# Patient Record
Sex: Female | Born: 1990
Health system: Southern US, Community
[De-identification: ages and names within clinical notes are randomized; demographics above are authoritative.]

## PROBLEM LIST (undated history)

## (undated) DIAGNOSIS — F53 Postpartum depression: Secondary | ICD-10-CM

## (undated) DIAGNOSIS — Z319 Encounter for procreative management, unspecified: Secondary | ICD-10-CM

## (undated) DIAGNOSIS — Z309 Encounter for contraceptive management, unspecified: Principal | ICD-10-CM

## (undated) DIAGNOSIS — N390 Urinary tract infection, site not specified: Principal | ICD-10-CM

## (undated) HISTORY — DX: Encounter for contraceptive management, unspecified: Z30.9

## (undated) HISTORY — PX: WISDOM TOOTH EXTRACTION: SHX21

## (undated) HISTORY — PX: URETHRAL DILATION: SUR417

## (undated) HISTORY — DX: Urinary tract infection, site not specified: N39.0

## (undated) HISTORY — DX: Encounter for procreative management, unspecified: Z31.9

## (undated) HISTORY — DX: Postpartum depression: F53.0

---

## 2011-07-09 ENCOUNTER — Other Ambulatory Visit (HOSPITAL_COMMUNITY)
Admission: RE | Admit: 2011-07-09 | Discharge: 2011-07-09 | Disposition: A | Discharge status: Home / Self Care | Payer: BC Managed Care – PPO | Source: Ambulatory Visit | Attending: Obstetrics and Gynecology | Admitting: Obstetrics and Gynecology

## 2011-07-09 DIAGNOSIS — Z01419 Encounter for gynecological examination (general) (routine) without abnormal findings: Secondary | ICD-10-CM | POA: Insufficient documentation

## 2012-10-09 ENCOUNTER — Other Ambulatory Visit: Payer: Self-pay | Admitting: Occupational Medicine

## 2012-10-09 ENCOUNTER — Ambulatory Visit: Payer: Self-pay

## 2012-10-09 DIAGNOSIS — R7612 Nonspecific reaction to cell mediated immunity measurement of gamma interferon antigen response without active tuberculosis: Secondary | ICD-10-CM

## 2012-10-18 ENCOUNTER — Encounter: Payer: Self-pay | Admitting: Obstetrics & Gynecology

## 2012-10-18 ENCOUNTER — Ambulatory Visit (INDEPENDENT_AMBULATORY_CARE_PROVIDER_SITE_OTHER): Payer: 59 | Admitting: Obstetrics & Gynecology

## 2012-10-18 VITALS — BP 110/70 | Ht 67.0 in | Wt 139.0 lb

## 2012-10-18 DIAGNOSIS — Z3202 Encounter for pregnancy test, result negative: Secondary | ICD-10-CM

## 2012-10-18 DIAGNOSIS — Z3049 Encounter for surveillance of other contraceptives: Secondary | ICD-10-CM

## 2012-10-18 DIAGNOSIS — Z309 Encounter for contraceptive management, unspecified: Secondary | ICD-10-CM

## 2012-10-18 LAB — POCT URINE PREGNANCY: Preg Test, Ur: NEGATIVE

## 2012-10-18 MED ORDER — MEDROXYPROGESTERONE ACETATE 150 MG/ML IM SUSP
150.0000 mg | Freq: Once | INTRAMUSCULAR | Status: AC
  Administered 2012-10-18: 150 mg via INTRAMUSCULAR

## 2012-11-29 ENCOUNTER — Encounter: Payer: Self-pay | Admitting: *Deleted

## 2012-11-30 ENCOUNTER — Encounter: Payer: Self-pay | Admitting: Adult Health

## 2012-11-30 ENCOUNTER — Other Ambulatory Visit (HOSPITAL_COMMUNITY)
Admission: RE | Admit: 2012-11-30 | Discharge: 2012-11-30 | Disposition: A | Discharge status: Home / Self Care | Payer: 59 | Source: Ambulatory Visit | Attending: Obstetrics and Gynecology | Admitting: Obstetrics and Gynecology

## 2012-11-30 ENCOUNTER — Ambulatory Visit (INDEPENDENT_AMBULATORY_CARE_PROVIDER_SITE_OTHER): Payer: 59 | Admitting: Adult Health

## 2012-11-30 VITALS — BP 110/70 | HR 72 | Ht 67.0 in | Wt 141.0 lb

## 2012-11-30 DIAGNOSIS — Z01419 Encounter for gynecological examination (general) (routine) without abnormal findings: Secondary | ICD-10-CM | POA: Insufficient documentation

## 2012-11-30 DIAGNOSIS — Z3200 Encounter for pregnancy test, result unknown: Secondary | ICD-10-CM

## 2012-11-30 DIAGNOSIS — Z309 Encounter for contraceptive management, unspecified: Secondary | ICD-10-CM

## 2012-11-30 DIAGNOSIS — Z3202 Encounter for pregnancy test, result negative: Secondary | ICD-10-CM

## 2012-11-30 MED ORDER — NORGESTIM-ETH ESTRAD TRIPHASIC 0.18/0.215/0.25 MG-35 MCG PO TABS: 1.0000 | ORAL_TABLET | Freq: Every day | ORAL | Status: DC

## 2012-11-30 NOTE — Progress Notes (Addendum)
Patient ID: Sara Kirby, female   DOB: 02-Mar-1991, 22 y.o.   MRN: [id number] History of Present Illness: [name] is a 22 year old [place] female, married in for a pap and physical. She is currently on Depo but wants to try the pill. 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 Last Depo in [name].  Current Medications, Allergies, Past Medical History, Past Surgical History, Family History and Social History were reviewed in [employer] record.    Review of Systems: Patient denies any headaches, blurred vision, shortness of breath, chest pain, abdominal pain, problems with bowel movements, urination, or intercourse. Positives as in HPI, she does have some occasional discomfort with sex but its positional.No joint pain.  Physical Exam:Blood pressure 110/70, pulse 72, height 5\' 7"  (1.702 m), weight 141 lb (63.957 kg).Urine pregnancy test was negative. General:  Well developed, well nourished, no acute distress Skin:  Warm and dry Neck:  Midline trachea, normal thyroid Lungs; Clear to auscultation bilaterally Breast:  No dominant palpable mass, retraction, or nipple discharge Cardiovascular: Regular rate and rhythm Abdomen:  Soft, non tender, no hepatosplenomegaly Pelvic:  External genitalia is normal in appearance.  The vagina is normal in appearance, and she has a low, prominent pubic bone.  The cervix is nulliparous .  Uterus is felt to be normal size, shape, and contour.  No   adnexal masses or tenderness noted Extremities:  No swelling or varicosities noted Psych:  Alert and cooperative, seems happy  Impression: Yearly exam Contraceptive management  Plan: Rx tri sprintec ,disp. 1 pack, take 1 daily with 11 refills, start them June 29. Follow up in 4 months to see if she feels better, if not may check some blood work. Physical in 1 year

## 2012-11-30 NOTE — Patient Instructions (Addendum)
Will start tri sprintec June 29  Follow up in 4 months Physical in 1 year Call prn

## 2013-01-18 ENCOUNTER — Ambulatory Visit: Payer: 59

## 2013-02-06 ENCOUNTER — Telehealth: Payer: Self-pay | Admitting: Adult Health

## 2013-02-06 NOTE — Telephone Encounter (Signed)
Left message I called 

## 2013-02-27 ENCOUNTER — Ambulatory Visit (INDEPENDENT_AMBULATORY_CARE_PROVIDER_SITE_OTHER): Payer: 59 | Admitting: Adult Health

## 2013-02-27 ENCOUNTER — Encounter: Payer: Self-pay | Admitting: Adult Health

## 2013-02-27 VITALS — BP 112/68 | Ht 67.0 in | Wt 138.0 lb

## 2013-02-27 DIAGNOSIS — N390 Urinary tract infection, site not specified: Secondary | ICD-10-CM

## 2013-02-27 HISTORY — DX: Urinary tract infection, site not specified: N39.0

## 2013-02-27 LAB — POCT URINALYSIS DIPSTICK

## 2013-02-27 MED ORDER — NITROFURANTOIN MONOHYD MACRO 100 MG PO CAPS: 100.0000 mg | ORAL_CAPSULE | Freq: Two times a day (BID) | ORAL | Status: DC

## 2013-02-27 NOTE — Progress Notes (Signed)
Subjective:     Patient ID: Sara Kirby, female   DOB: Sep 08, 1990, 22 y.o.   MRN: [id number]  HPI Estera is in complaining of UTI symptoms, she is having burning on and off with urination and has taken cipro.  Review of Systems Positives in HPI Reviewed past medical,surgical, social and family history. Reviewed medications and allergies.     Objective:   Physical Exam BP 112/68  Ht 5\' 7"  (1.702 m)  Wt 138 lb (62.596 kg)  BMI 21.61 kg/m2  LMP 08/20/2014urine dipstick +blood and protein.No CVAT Skin warm and dry.Pelvic: external genitalia is normal in appearance, vagina: brown discharge without odor, cervix:smooth, uterus: normal size, shape and contour, non tender, no masses felt, adnexa: no masses or tenderness noted.     Assessment:     UTI    Plan:    Stop cipro Rx macrobid 1 bid x 7 days #14 no refills  Try urogesic blue Number of samples 12 Lot number [id number]     Exp date 11/15   push fluids UA C&S Review handout on UTI

## 2013-02-27 NOTE — Patient Instructions (Addendum)
Urinary Tract Infection Urinary tract infections (UTIs) can develop anywhere along your urinary tract. Your urinary tract is your body's drainage system for removing wastes and extra water. Your urinary tract includes two kidneys, two ureters, a bladder, and a urethra. Your kidneys are a pair of bean-shaped organs. Each kidney is about the size of your fist. They are located below your ribs, one on each side of your spine. CAUSES Infections are caused by microbes, which are microscopic organisms, including fungi, viruses, and bacteria. These organisms are so small that they can only be seen through a microscope. Bacteria are the microbes that most commonly cause UTIs. SYMPTOMS  Symptoms of UTIs may vary by age and gender of the patient and by the location of the infection. Symptoms in young women typically include a frequent and intense urge to urinate and a painful, burning feeling in the bladder or urethra during urination. Older women and men are more likely to be tired, shaky, and weak and have muscle aches and abdominal pain. A fever may mean the infection is in your kidneys. Other symptoms of a kidney infection include pain in your back or sides below the ribs, nausea, and vomiting. DIAGNOSIS To diagnose a UTI, your caregiver will ask you about your symptoms. Your caregiver also will ask to provide a urine sample. The urine sample will be tested for bacteria and Mennella blood cells. Pick blood cells are made by your body to help fight infection. TREATMENT  Typically, UTIs can be treated with medication. Because most UTIs are caused by a bacterial infection, they usually can be treated with the use of antibiotics. The choice of antibiotic and length of treatment depend on your symptoms and the type of bacteria causing your infection. HOME CARE INSTRUCTIONS  If you were prescribed antibiotics, take them exactly as your caregiver instructs you. Finish the medication even if you feel better after you  have only taken some of the medication.  Drink enough water and fluids to keep your urine clear or pale yellow.  Avoid caffeine, tea, and carbonated beverages. They tend to irritate your bladder.  Empty your bladder often. Avoid holding urine for long periods of time.  Empty your bladder before and after sexual intercourse.  After a bowel movement, women should cleanse from front to back. Use each tissue only once. SEEK MEDICAL CARE IF:   You have back pain.  You develop a fever.  Your symptoms do not begin to resolve within 3 days. SEEK IMMEDIATE MEDICAL CARE IF:   You have severe back pain or lower abdominal pain.  You develop chills.  You have nausea or vomiting.  You have continued burning or discomfort with urination. MAKE SURE YOU:   Understand these instructions.  Will watch your condition.  Will get help right away if you are not doing well or get worse. Document Released: 03/31/2005 Document Revised: 12/21/2011 Document Reviewed: 07/30/2011 [facility] Patient Information 2014 [place], [place]. Take macrobid Push fluids No sex Await C&S

## 2013-02-28 LAB — URINALYSIS
Ketones, ur: NEGATIVE mg/dL
Nitrite: NEGATIVE
Protein, ur: NEGATIVE mg/dL
Urobilinogen, UA: 0.2 mg/dL (ref 0.0–1.0)

## 2013-02-28 LAB — URINE CULTURE
Colony Count: NO GROWTH
Organism ID, Bacteria: NO GROWTH

## 2013-03-08 ENCOUNTER — Telehealth: Payer: Self-pay | Admitting: Obstetrics and Gynecology

## 2013-03-08 NOTE — Telephone Encounter (Signed)
Spoke with pt. Urine culture showed no growth. Pt feels like urinary symptoms are getting worse. Advised to call primary dr to see if they can see her tomorrow. Pt voiced understanding. JSY

## 2013-04-02 ENCOUNTER — Ambulatory Visit (INDEPENDENT_AMBULATORY_CARE_PROVIDER_SITE_OTHER): Payer: 59 | Admitting: Adult Health

## 2013-04-02 ENCOUNTER — Encounter: Payer: Self-pay | Admitting: Adult Health

## 2013-04-02 VITALS — BP 120/76 | Ht 67.0 in | Wt 140.0 lb

## 2013-04-02 DIAGNOSIS — Z3049 Encounter for surveillance of other contraceptives: Secondary | ICD-10-CM

## 2013-04-02 DIAGNOSIS — Z309 Encounter for contraceptive management, unspecified: Secondary | ICD-10-CM

## 2013-04-02 HISTORY — DX: Encounter for contraceptive management, unspecified: Z30.9

## 2013-04-02 NOTE — Patient Instructions (Addendum)
Follow up next year for physcial Continue tri sprintec

## 2013-04-02 NOTE — Progress Notes (Signed)
Subjective:     Patient ID: Sara Kirby, female   DOB: 1990/08/03, 22 y.o.   MRN: [id number]  HPI Sara Kirby is back for follow up after changing to OCs and she is feeling better less tired and not cranky.  Review of Systems See HPI Reviewed past medical,surgical, social and family history. Reviewed medications and allergies.     Objective:   Physical Exam BP 120/76  Ht 5\' 7"  (1.702 m)  Wt 140 lb (63.504 kg)  BMI 21.92 kg/m2  LMP 03/20/2013   doing great on tri sprintec will continue Assessment:     Contraceptive management    Plan:    Continue pills Follow up next year for physical

## 2013-11-16 ENCOUNTER — Other Ambulatory Visit: Payer: Self-pay | Admitting: Adult Health

## 2013-11-21 IMAGING — CR DG CHEST 1V
1 series · 1 of 1 positions shown · non-contrast
Comparison: None.

CLINICAL DATA: Positive QFTG.

CHEST - 1 VIEW

[view not recorded]
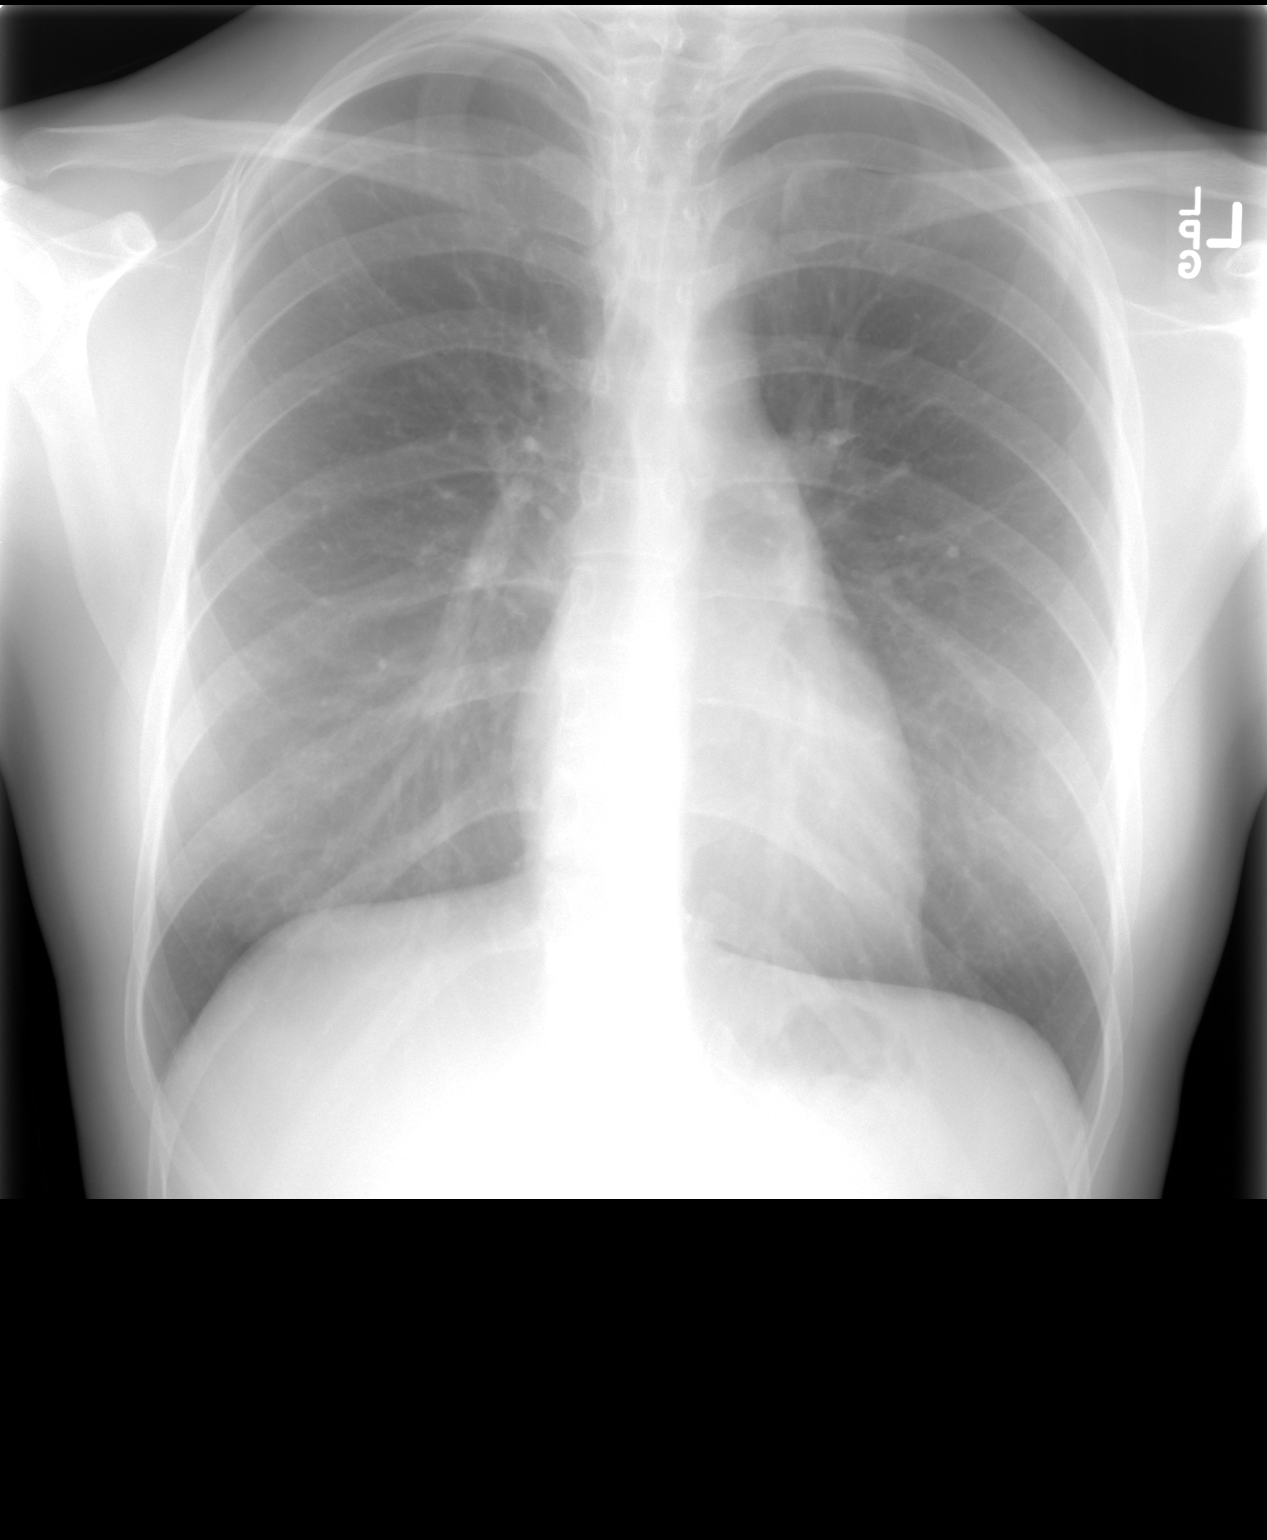

[1 of 1 positions shown; findings below may reference images not displayed]

FINDINGS: Cardiomediastinal silhouette is within normal limits.
The lungs are free of focal consolidations and pleural effusions.
No evidence for adenopathy. Visualized osseous structures have a
normal appearance.
IMPRESSION: Negative exam.

## 2014-05-27 ENCOUNTER — Encounter (HOSPITAL_COMMUNITY): Payer: Self-pay | Admitting: Emergency Medicine

## 2014-05-27 ENCOUNTER — Emergency Department (HOSPITAL_COMMUNITY)
Admission: EM | Admit: 2014-05-27 | Discharge: 2014-05-27 | Disposition: A | Discharge status: Home / Self Care | Payer: 59 | Source: Home / Self Care

## 2014-05-27 DIAGNOSIS — N10 Acute tubulo-interstitial nephritis: Secondary | ICD-10-CM

## 2014-05-27 LAB — POCT URINALYSIS DIP (DEVICE)
GLUCOSE, UA: 250 mg/dL — AB
Ketones, ur: >=160 mg/dL — AB
NITRITE: POSITIVE — AB
PH: 5 (ref 5.0–8.0)
Protein, ur: >=300 mg/dL — AB
SPECIFIC GRAVITY, URINE: 1.01 (ref 1.005–1.030)

## 2014-05-27 LAB — POCT PREGNANCY, URINE: Preg Test, Ur: NEGATIVE

## 2014-05-27 MED ORDER — CEFTRIAXONE SODIUM 1 G IJ SOLR
1.0000 g | Freq: Once | INTRAMUSCULAR | Status: AC
  Administered 2014-05-27: 1 g via INTRAMUSCULAR

## 2014-05-27 MED ORDER — LIDOCAINE HCL (PF) 1 % IJ SOLN
INTRAMUSCULAR | Status: AC
  Filled 2014-05-27: qty 5

## 2014-05-27 MED ORDER — CEFTRIAXONE SODIUM 1 G IJ SOLR
INTRAMUSCULAR | Status: AC
  Filled 2014-05-27: qty 10

## 2014-05-27 MED ORDER — HYDROCODONE-ACETAMINOPHEN 5-325 MG PO TABS
ORAL_TABLET | ORAL | Status: DC
Start: 2014-05-27 — End: 2014-12-17

## 2014-05-27 MED ORDER — CEPHALEXIN 500 MG PO CAPS: 500.0000 mg | ORAL_CAPSULE | Freq: Three times a day (TID) | ORAL | Status: DC

## 2014-05-27 NOTE — ED Provider Notes (Signed)
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 She denies any hematuria. She has had lower back and lower abdominal pain, temperature to 100.6, and nausea but no vomiting. She denies any GYN complaints. She has had UTIs in the past, but none recently and has had to have her urethra dilated. She is never had pyelonephritis, and never had to be hospitalized for UTI.  Review of Systems   Other than as noted above, the patient denies any of the following symptoms: General:  No fevers or chills. GI:  No abdominal pain, back pain, nausea, or vomiting. GU:  No hematuria or incontinence. GYN:  No discharge, itching, vulvar pain or lesions, pelvic pain, or abnormal vaginal bleeding.  [place]   Past medical history, family history, social history, meds, and allergies were reviewed.  Her only medication is birth control pills.  Physical Examination     Vital signs:  BP 125/84 mmHg  Pulse 109  Temp(Src) 100.6 F (38.1 C) (Oral)  Resp 16  SpO2 100% Gen:  Alert, oriented, in no distress. Lungs:  Clear to auscultation, no wheezes, rales or rhonchi. Heart:  Regular rhythm, no gallop or murmer. Abdomen:  Flat and soft. There was slight suprapubic pain to palpation.  No guarding, or rebound.  No hepato-splenomegaly or mass.  Bowel sounds were normally active.  No hernia. Back:  No CVA tenderness.  Skin:  Clear, warm and dry.  Labs   Results for orders placed or performed during the hospital encounter of 05/27/14  POCT urinalysis dip (device)  Result Value Ref Range   Glucose, UA 250 (A) NEGATIVE mg/dL   Bilirubin Urine LARGE (A) NEGATIVE   Ketones, ur >=160 (A) NEGATIVE mg/dL   Specific Gravity, Urine 1.010 1.005 - 1.030   Hgb urine dipstick TRACE (A) NEGATIVE   pH 5.0 5.0 - 8.0   Protein, ur >=300 (A) NEGATIVE  mg/dL   Urobilinogen, UA >=8.0 0.0 - 1.0 mg/dL   Nitrite POSITIVE (A) NEGATIVE   Leukocytes, UA LARGE (A) NEGATIVE  Pregnancy, urine POC  Result Value Ref Range   Preg Test, Ur NEGATIVE NEGATIVE     A urine culture was obtained.  Results are pending at this time and we will call about any positive results.  Course in [facility]   She was given Rocephin 1 g IM.  Assessment   The encounter diagnosis was Acute pyelonephritis.   Plan   1.  Meds:  The following meds were prescribed:   New Prescriptions   CEPHALEXIN (KEFLEX) 500 MG CAPSULE    Take 1 capsule (500 mg total) by mouth 3 (three) times daily.   HYDROCODONE-ACETAMINOPHEN (NORCO/VICODIN) 5-325 MG PER TABLET    1 to 2 tabs every 4 to 6 hours as needed for pain.    2.  Patient Education/Counseling:  The patient was given appropriate handouts, self care instructions, and instructed in symptomatic relief. The patient was told to avoid intercourse for 10 days, get extra fluids, and return for a follow up with her primary care doctor at the completion of treatment for a repeat UA and culture.    3.  Follow up:  The patient was told to follow up here for a scheduled recheck in 48 hours, or sooner if becoming worse in any way, and given some red flag symptoms  such as fever, persistent vomiting, or severe flank or abdominal pain which would prompt immediate return.     Harden Mo, MD 05/27/14 [phone]

## 2014-05-27 NOTE — ED Notes (Signed)
Patient is not ready for discharge, orders to be carried out.  Patient aware of post injection delay prior to discharge from department

## 2014-05-27 NOTE — Discharge Instructions (Signed)
Pyelonephritis, Adult °Pyelonephritis is a kidney infection. In general, there are 2 main types of pyelonephritis: °· Infections that come on quickly without any warning (acute pyelonephritis). °· Infections that persist for a long period of time (chronic pyelonephritis). °CAUSES  °Two main causes of pyelonephritis are: °· Bacteria traveling from the bladder to the kidney. This is a problem especially in pregnant women. The urine in the bladder can become filled with bacteria from multiple causes, including: °¨ Inflammation of the prostate gland (prostatitis). °¨ Sexual intercourse in females. °¨ Bladder infection (cystitis). °· Bacteria traveling from the bloodstream to the tissue part of the kidney. °Problems that may increase your risk of getting a kidney infection include: °· Diabetes. °· Kidney stones or bladder stones. °· Cancer. °· Catheters placed in the bladder. °· Other abnormalities of the kidney or ureter. °SYMPTOMS  °· Abdominal pain. °· Pain in the side or flank area. °· Fever. °· Chills. °· Upset stomach. °· Blood in the urine (dark urine). °· Frequent urination. °· Strong or persistent urge to urinate. °· Burning or stinging when urinating. °DIAGNOSIS  °Your caregiver may diagnose your kidney infection based on your symptoms. A urine sample may also be taken. °TREATMENT  °In general, treatment depends on how severe the infection is.  °· If the infection is mild and caught early, your caregiver may treat you with oral antibiotics and send you home. °· If the infection is more severe, the bacteria may have gotten into the bloodstream. This will require intravenous (IV) antibiotics and a hospital stay. Symptoms may include: °¨ High fever. °¨ Severe flank pain. °¨ Shaking chills. °· Even after a hospital stay, your caregiver may require you to be on oral antibiotics for a period of time. °· Other treatments may be required depending upon the cause of the infection. °HOME CARE INSTRUCTIONS  °· Take your  antibiotics as directed. Finish them even if you start to feel better. °· Make an appointment to have your urine checked to make sure the infection is gone. °· Drink enough fluids to keep your urine clear or pale yellow. °· Take medicines for the bladder if you have urgency and frequency of urination as directed by your caregiver. °SEEK IMMEDIATE MEDICAL CARE IF:  °· You have a fever or persistent symptoms for more than 2-3 days. °· You have a fever and your symptoms suddenly get worse. °· You are unable to take your antibiotics or fluids. °· You develop shaking chills. °· You experience extreme weakness or fainting. °· There is no improvement after 2 days of treatment. °MAKE SURE YOU: °· Understand these instructions. °· Will watch your condition. °· Will get help right away if you are not doing well or get worse. °Document Released: 06/21/2005 Document Revised: 12/21/2011 Document Reviewed: 11/25/2010 °ExitCare® Patient Information ©2015 [employer]. This information is not intended to replace advice given to you by your health care provider. Make sure you discuss any questions you have with your health care provider. ° °

## 2014-05-28 LAB — URINE CULTURE
COLONY COUNT: NO GROWTH
Culture: NO GROWTH
Special Requests: NORMAL

## 2014-05-29 ENCOUNTER — Emergency Department (HOSPITAL_COMMUNITY)
Admission: EM | Admit: 2014-05-29 | Discharge: 2014-05-29 | Disposition: A | Discharge status: Home / Self Care | Payer: 59 | Source: Home / Self Care | Attending: Emergency Medicine | Admitting: Emergency Medicine

## 2014-05-29 ENCOUNTER — Encounter (HOSPITAL_COMMUNITY): Payer: Self-pay | Admitting: Emergency Medicine

## 2014-05-29 DIAGNOSIS — N10 Acute tubulo-interstitial nephritis: Secondary | ICD-10-CM

## 2014-05-29 LAB — POCT PREGNANCY, URINE: Preg Test, Ur: NEGATIVE

## 2014-05-29 LAB — POCT URINALYSIS DIP (DEVICE)
Bilirubin Urine: NEGATIVE
GLUCOSE, UA: NEGATIVE mg/dL
HGB URINE DIPSTICK: NEGATIVE
Ketones, ur: NEGATIVE mg/dL
Leukocytes, UA: NEGATIVE
NITRITE: NEGATIVE
PROTEIN: NEGATIVE mg/dL
Specific Gravity, Urine: >=1.03 (ref 1.005–1.030)
Urobilinogen, UA: 0.2 mg/dL (ref 0.0–1.0)
pH: 6 (ref 5.0–8.0)

## 2014-05-29 MED ORDER — UROGESIC-BLUE 81.6 MG PO TABS: ORAL_TABLET | ORAL | Status: DC

## 2014-05-29 NOTE — Discharge Instructions (Signed)
Pyelonephritis, Adult °Pyelonephritis is a kidney infection. In general, there are 2 main types of pyelonephritis: °· Infections that come on quickly without any warning (acute pyelonephritis). °· Infections that persist for a long period of time (chronic pyelonephritis). °CAUSES  °Two main causes of pyelonephritis are: °· Bacteria traveling from the bladder to the kidney. This is a problem especially in pregnant women. The urine in the bladder can become filled with bacteria from multiple causes, including: °¨ Inflammation of the prostate gland (prostatitis). °¨ Sexual intercourse in females. °¨ Bladder infection (cystitis). °· Bacteria traveling from the bloodstream to the tissue part of the kidney. °Problems that may increase your risk of getting a kidney infection include: °· Diabetes. °· Kidney stones or bladder stones. °· Cancer. °· Catheters placed in the bladder. °· Other abnormalities of the kidney or ureter. °SYMPTOMS  °· Abdominal pain. °· Pain in the side or flank area. °· Fever. °· Chills. °· Upset stomach. °· Blood in the urine (dark urine). °· Frequent urination. °· Strong or persistent urge to urinate. °· Burning or stinging when urinating. °DIAGNOSIS  °Your caregiver may diagnose your kidney infection based on your symptoms. A urine sample may also be taken. °TREATMENT  °In general, treatment depends on how severe the infection is.  °· If the infection is mild and caught early, your caregiver may treat you with oral antibiotics and send you home. °· If the infection is more severe, the bacteria may have gotten into the bloodstream. This will require intravenous (IV) antibiotics and a hospital stay. Symptoms may include: °¨ High fever. °¨ Severe flank pain. °¨ Shaking chills. °· Even after a hospital stay, your caregiver may require you to be on oral antibiotics for a period of time. °· Other treatments may be required depending upon the cause of the infection. °HOME CARE INSTRUCTIONS  °· Take your  antibiotics as directed. Finish them even if you start to feel better. °· Make an appointment to have your urine checked to make sure the infection is gone. °· Drink enough fluids to keep your urine clear or pale yellow. °· Take medicines for the bladder if you have urgency and frequency of urination as directed by your caregiver. °SEEK IMMEDIATE MEDICAL CARE IF:  °· You have a fever or persistent symptoms for more than 2-3 days. °· You have a fever and your symptoms suddenly get worse. °· You are unable to take your antibiotics or fluids. °· You develop shaking chills. °· You experience extreme weakness or fainting. °· There is no improvement after 2 days of treatment. °MAKE SURE YOU: °· Understand these instructions. °· Will watch your condition. °· Will get help right away if you are not doing well or get worse. °Document Released: 06/21/2005 Document Revised: 12/21/2011 Document Reviewed: 11/25/2010 °ExitCare® Patient Information ©2015 [employer]. This information is not intended to replace advice given to you by your health care provider. Make sure you discuss any questions you have with your health care provider. ° °

## 2014-05-29 NOTE — ED Provider Notes (Signed)
Chief Complaint   Follow-up   History of Present Illness   Sara Kirby is a 23 year old female who returns today for a scheduled follow-up on acute pyelonephritis. She feels a lot better. She has defervesced. She still has mild back pain and slight dysuria. No abdominal pain, nausea, or vomiting. She has been taking some Urised which is making her urine turn blue. She denies any blood in the urine or GYN complaints. She has been taking her cephalexin. Her urine culture turned out negative, but she admitted to taking a few left over antibiotics at home before getting the urine culture here.  Review of Systems   Other than as noted above, the patient denies any of the following symptoms: General:  No fevers or chills. GI:  No abdominal pain, back pain, nausea, or vomiting. GU:  No hematuria or incontinence. GYN:  No discharge, itching, vulvar pain or lesions, pelvic pain, or abnormal vaginal bleeding.  [place]   Past medical history, family history, social history, meds, and allergies were reviewed.    Physical Examination     Vital signs:  BP 134/71 mmHg  Pulse 92  Temp(Src) 98.7 F (37.1 C) (Oral)  Resp 18  SpO2 100% Gen:  Alert, oriented, in no distress. Lungs:  Clear to auscultation, no wheezes, rales or rhonchi. Heart:  Regular rhythm, no gallop or murmer. Abdomen:  Flat and soft. There was slight suprapubic pain to palpation.  No guarding, or rebound.  No hepato-splenomegaly or mass.  Bowel sounds were normally active.  No hernia. Back:  No CVA tenderness.  Skin:  Clear, warm and dry.  Labs   Results for orders placed or performed during the hospital encounter of 05/29/14  POCT urinalysis dip (device)  Result Value Ref Range   Glucose, UA NEGATIVE NEGATIVE mg/dL   Bilirubin Urine NEGATIVE NEGATIVE   Ketones, ur NEGATIVE NEGATIVE mg/dL   Specific Gravity, Urine >=1.030 1.005 - 1.030   Hgb urine dipstick NEGATIVE NEGATIVE   pH 6.0 5.0 - 8.0   Protein, ur NEGATIVE  NEGATIVE mg/dL   Urobilinogen, UA 0.2 0.0 - 1.0 mg/dL   Nitrite NEGATIVE NEGATIVE   Leukocytes, UA NEGATIVE NEGATIVE  Pregnancy, urine POC  Result Value Ref Range   Preg Test, Ur NEGATIVE NEGATIVE    Assessment   The encounter diagnosis was Acute pyelonephritis.   She seems to be improving. She is afebrile her urine is now clear. Symptomatically she is feeling better as well. I think the reason why her urine culture was negative is because she took a few antibiotics at home and was already partially treated.  Plan   1.  Meds:  The following meds were prescribed:   Discharge Medication List as of 05/29/2014  6:09 PM    START taking these medications   Details  Methen-Hyosc-Meth Blue-Na Phos (UROGESIC-BLUE) 81.6 MG TABS Take 1 3 times daily as needed, Normal       She was urged to finish up her antibiotics.  2.  Patient Education/Counseling:  The patient was given appropriate handouts, self care instructions, and instructed in symptomatic relief. The patient was told to avoid intercourse for 10 days, get extra fluids, and return for a follow up with her primary care doctor at the completion of treatment for a repeat UA and culture.    3.  Follow up:  The patient was told to follow up here if no better in 3 to 4 days, or sooner if becoming worse in any way, and  given some red flag symptoms such as fever, persistent vomiting, or severe flank or abdominal pain which would prompt immediate return.     Harden Mo, MD 05/29/14 [phone]

## 2014-05-29 NOTE — ED Notes (Signed)
Patient seen 11/23 for uti, here today for recheck .  Reports feeling much better

## 2014-10-31 ENCOUNTER — Other Ambulatory Visit: Payer: Self-pay | Admitting: Adult Health

## 2014-11-24 ENCOUNTER — Other Ambulatory Visit: Payer: Self-pay | Admitting: Adult Health

## 2014-12-11 ENCOUNTER — Telehealth: Payer: Self-pay | Admitting: Adult Health

## 2014-12-11 MED ORDER — NORGESTIM-ETH ESTRAD TRIPHASIC 0.18/0.215/0.25 MG-35 MCG PO TABS: 1.0000 | ORAL_TABLET | Freq: Every day | ORAL | Status: DC

## 2014-12-11 NOTE — Telephone Encounter (Signed)
Refilled OCs 

## 2014-12-11 NOTE — Telephone Encounter (Signed)
Spoke with pt. Pt needs a refill on her birth control. Thanks!! [place]

## 2014-12-17 ENCOUNTER — Other Ambulatory Visit (HOSPITAL_COMMUNITY)
Admission: RE | Admit: 2014-12-17 | Discharge: 2014-12-17 | Disposition: A | Discharge status: Home / Self Care | Payer: 59 | Source: Ambulatory Visit | Attending: Obstetrics and Gynecology | Admitting: Obstetrics and Gynecology

## 2014-12-17 ENCOUNTER — Ambulatory Visit (INDEPENDENT_AMBULATORY_CARE_PROVIDER_SITE_OTHER): Payer: 59 | Admitting: Adult Health

## 2014-12-17 ENCOUNTER — Encounter: Payer: Self-pay | Admitting: Adult Health

## 2014-12-17 VITALS — BP 108/60 | HR 68 | Ht 67.0 in | Wt 139.5 lb

## 2014-12-17 DIAGNOSIS — Z01419 Encounter for gynecological examination (general) (routine) without abnormal findings: Secondary | ICD-10-CM | POA: Diagnosis not present

## 2014-12-17 DIAGNOSIS — Z3041 Encounter for surveillance of contraceptive pills: Secondary | ICD-10-CM

## 2014-12-17 MED ORDER — NORGESTIM-ETH ESTRAD TRIPHASIC 0.18/0.215/0.25 MG-35 MCG PO TABS: 1.0000 | ORAL_TABLET | Freq: Every day | ORAL | Status: DC

## 2014-12-17 NOTE — Progress Notes (Signed)
Patient ID: Sara Kirby, female   DOB: [birth year], 24 y.o.   MRN: [id number] History of Present Illness: Sara Kirby is a 24 year old [place] female, married in for well woman gyn exam and pap and refill OCs.   Current Medications, Allergies, Past Medical History, Past Surgical History, Family History and Social History were reviewed in [employer] record.     Review of Systems: Patient denies any headaches, hearing loss, fatigue, blurred vision, shortness of breath, chest pain, abdominal pain, problems with bowel movements, urination, or intercourse. No joint pain or mood swings.Works nights at [employer] at present.    Physical Exam:BP 108/60 mmHg  Pulse 68  Ht 5\' 7"  (1.702 m)  Wt 139 lb 8 oz (63.277 kg)  BMI 21.84 kg/m2  LMP 11/26/2014 General:  Well developed, well nourished, no acute distress Skin:  Warm and dry Neck:  Midline trachea, normal thyroid, good ROM, no lymphadenopathy Lungs; Clear to auscultation bilaterally Breast:  No dominant palpable mass, retraction, or nipple discharge Cardiovascular: Regular rate and rhythm Abdomen:  Soft, non tender, no hepatosplenomegaly Pelvic:  External genitalia is normal in appearance, no lesions.  The vagina is normal in appearance. Urethra has no lesions or masses. The cervix is tiny and smooth, pap performed.  Uterus is felt to be normal size, shape, and contour.  No adnexal masses or tenderness noted.Bladder is non tender, no masses felt. Extremities/musculoskeletal:  No swelling or varicosities noted, no clubbing or cyanosis Psych:  No mood changes, alert and cooperative,seems happy   Impression: Well woman gyn exam with pap Contraceptive management    Plan: Physical in 1 year, pap in 2 Refilled tri-previfem x 1 year

## 2014-12-17 NOTE — Patient Instructions (Signed)
Physical in 1 year Pap in 2 years

## 2014-12-19 LAB — CYTOLOGY - PAP

## 2015-03-15 ENCOUNTER — Other Ambulatory Visit: Payer: Self-pay | Admitting: Adult Health

## 2015-12-18 ENCOUNTER — Other Ambulatory Visit: Payer: 59 | Admitting: Adult Health

## 2015-12-30 ENCOUNTER — Other Ambulatory Visit: Payer: 59 | Admitting: Adult Health

## 2016-01-09 ENCOUNTER — Encounter: Payer: Self-pay | Admitting: Adult Health

## 2016-01-09 ENCOUNTER — Ambulatory Visit (INDEPENDENT_AMBULATORY_CARE_PROVIDER_SITE_OTHER): Payer: 59 | Admitting: Adult Health

## 2016-01-09 VITALS — BP 106/72 | HR 82 | Ht 67.0 in | Wt 138.5 lb

## 2016-01-09 DIAGNOSIS — Z01419 Encounter for gynecological examination (general) (routine) without abnormal findings: Secondary | ICD-10-CM

## 2016-01-09 DIAGNOSIS — Z319 Encounter for procreative management, unspecified: Secondary | ICD-10-CM

## 2016-01-09 HISTORY — DX: Encounter for procreative management, unspecified: Z31.9

## 2016-01-09 NOTE — Patient Instructions (Signed)
Physical in 1 year, pap 2019 Discussed timing of sex  Preparing for Pregnancy Before trying to become pregnant, make an appointment with your health care provider (preconception care). The goal is to help you have a healthy, safe pregnancy. At your first appointment, your health care provider will:   Do a complete physical exam, including a Pap test.  Take a complete medical history.  Give you advice and help you resolve any problems. PRECONCEPTION CHECKLIST Here is a list of the basics to cover with your health care provider at your preconception visit:  Medical history.  Tell your health care provider about any diseases you have had. Many diseases can affect your pregnancy.  Include your partner's medical history and family history.  Make sure you have been tested for sexually transmitted infections (STIs). These can affect your pregnancy. In some cases, they can be passed to your baby. Tell your health care provider about any history of STIs.  Make sure your health care provider knows about any previous problems you have had with conception or pregnancy.  Tell your health care provider about any medicine you take. This includes herbal supplements and over-the-counter medicines.  Make sure all your immunizations are up to date. You may need to make additional appointments.  Ask your health care provider if you need any vaccinations or if there are any you should avoid.  Diet.  It is especially important to eat a healthy, balanced diet with the right nutrients when you are pregnant.  Ask your health care provider to help you get to a healthy weight before pregnancy.  If you are overweight, you are at higher risk for certain complications. These include high blood pressure, diabetes, and preterm birth.  If you are underweight, you are more likely to have a low-birth-weight baby.  Lifestyle.  Tell your health care provider about lifestyle factors such as alcohol use, drug use,  or smoking.  Describe any harmful substances you may be exposed to at work or home. These can include chemicals, pesticides, and radiation.  Mental health.  Let your health care provider know if you have been feeling depressed or anxious.  Let your health care provider know if you have a history of substance abuse.  Let your health care provider know if you do not feel safe at home. HOME INSTRUCTIONS TO PREPARE FOR PREGNANCY Follow your health care provider's advice and instructions.   Keep an accurate record of your menstrual periods. This makes it easier for your health care provider to determine your baby's due date.  Begin taking prenatal vitamins and folic acid supplements daily. Take them as directed by your health care provider.  Eat a balanced diet. Get help from a nutrition counselor if you have questions or need help.  Get regular exercise. Try to be active for at least 30 minutes a day most days of the week.  Quit smoking, if you smoke.  Do not drink alcohol.  Do not take illegal drugs.  Get medical problems, such as diabetes or high blood pressure, under control.  If you have diabetes, make sure you do the following:  Have good blood sugar control. If you have type 1 diabetes, use multiple daily doses of insulin. Do not use split-dose or premixed insulin.  Have an eye exam by a qualified eye care professional trained in caring for people with diabetes.  Get evaluated by your health care provider for cardiovascular disease.  Get to a healthy weight. If you are overweight or  obese, reduce your weight with the help of a qualified health professional such as a Firefighter. Ask your health care provider what the right weight range is for you. HOW DO I KNOW I AM PREGNANT? You may be pregnant if you have been sexually active and you miss your period. Symptoms of early pregnancy include:   Mild cramping.  Very light vaginal bleeding (spotting).  Feeling  unusually tired.  Morning sickness. If you have any of these symptoms, take a home pregnancy test. These tests look for a hormone called human chorionic gonadotropin (hCG) in your urine. Your body begins to make this hormone during early pregnancy. These tests are very accurate. Wait until at least the first day you miss your period to take one. If you get a positive result, call your health care provider to make appointments for prenatal care. WHAT SHOULD I DO IF I BECOME PREGNANT?  Make an appointment with your health care provider by week 12 of your pregnancy at the latest.  Do not smoke. Smoking can be harmful to your baby.  Do not drink alcoholic beverages. Alcohol is related to a number of birth defects.  Avoid toxic odors and chemicals.  You may continue to have sexual intercourse if it does not cause pain or other problems, such as vaginal bleeding.   This information is not intended to replace advice given to you by your health care provider. Make sure you discuss any questions you have with your health care provider.   Document Released: 06/03/2008 Document Revised: 07/12/2014 Document Reviewed: 05/28/2013 Elsevier Interactive Patient Education [employer].

## 2016-01-09 NOTE — Progress Notes (Signed)
Patient ID: Sara Kirby, female   DOB: 26-Feb-1991, 26 y.o.   MRN: [id number] History of Present Illness: Sara Kirby is a 25 year old [place] female, married in for well woman gyn exam, she had a normal pap 12/17/14.She has just started to try and get pregnant on last month.   Current Medications, Allergies, Past Medical History, Past Surgical History, Family History and Social History were reviewed in [employer] record.     Review of Systems: Patient denies any headaches, hearing loss, fatigue, blurred vision, shortness of breath, chest pain, abdominal pain, problems with bowel movements, urination, or intercourse. No joint pain or mood swings.    Physical Exam:BP 106/72 mmHg  Pulse 82  Ht 5\' 7"  (1.702 m)  Wt 138 lb 8 oz (62.823 kg)  BMI 21.69 kg/m2  LMP 12/30/2015 General:  Well developed, well nourished, no acute distress Skin:  Warm and dry Neck:  Midline trachea, normal thyroid, good ROM, no lymphadenopathy Lungs; Clear to auscultation bilaterally Breast:  No dominant palpable mass, retraction, or nipple discharge Cardiovascular: Regular rate and rhythm Abdomen:  Soft, non tender, no hepatosplenomegaly Pelvic:  External genitalia is normal in appearance, no lesions.  The vagina is normal in appearance. Urethra has no lesions or masses. The cervix is smooth, with thinning ovulatory mucous.  Uterus is felt to be normal size, shape, and contour.  No adnexal masses or tenderness noted.Bladder is non tender, no masses felt. Extremities/musculoskeletal:  No swelling or varicosities noted, no clubbing or cyanosis Psych:  No mood changes, alert and cooperative,seems happy Discussed timing of sex, every other day, 7-24 of cycle and pee before sex and lay there after sex for 30 minutes.  Impression: Well woman gyn exam no pap Desires pregnancy    Plan: Physical in 1 year, pap 2019 Call with +HPT

## 2016-01-11 DIAGNOSIS — N390 Urinary tract infection, site not specified: Secondary | ICD-10-CM | POA: Diagnosis not present

## 2016-01-29 DIAGNOSIS — N390 Urinary tract infection, site not specified: Secondary | ICD-10-CM | POA: Diagnosis not present

## 2016-01-29 DIAGNOSIS — R300 Dysuria: Secondary | ICD-10-CM | POA: Diagnosis not present

## 2016-01-29 DIAGNOSIS — Z0000 Encounter for general adult medical examination without abnormal findings: Secondary | ICD-10-CM | POA: Diagnosis not present

## 2016-02-11 DIAGNOSIS — N390 Urinary tract infection, site not specified: Secondary | ICD-10-CM | POA: Diagnosis not present

## 2016-06-10 ENCOUNTER — Ambulatory Visit (INDEPENDENT_AMBULATORY_CARE_PROVIDER_SITE_OTHER): Payer: 59 | Admitting: Adult Health

## 2016-06-10 ENCOUNTER — Encounter: Payer: Self-pay | Admitting: Adult Health

## 2016-06-10 VITALS — BP 110/60 | HR 92 | Ht 68.0 in | Wt 139.5 lb

## 2016-06-10 DIAGNOSIS — N926 Irregular menstruation, unspecified: Secondary | ICD-10-CM | POA: Diagnosis not present

## 2016-06-10 DIAGNOSIS — R110 Nausea: Secondary | ICD-10-CM | POA: Diagnosis not present

## 2016-06-10 DIAGNOSIS — R252 Cramp and spasm: Secondary | ICD-10-CM

## 2016-06-10 DIAGNOSIS — O3680X0 Pregnancy with inconclusive fetal viability, not applicable or unspecified: Secondary | ICD-10-CM

## 2016-06-10 DIAGNOSIS — Z3201 Encounter for pregnancy test, result positive: Secondary | ICD-10-CM | POA: Diagnosis not present

## 2016-06-10 DIAGNOSIS — Z3490 Encounter for supervision of normal pregnancy, unspecified, unspecified trimester: Secondary | ICD-10-CM

## 2016-06-10 LAB — POCT URINE PREGNANCY: PREG TEST UR: POSITIVE — AB

## 2016-06-10 NOTE — Patient Instructions (Signed)
First Trimester of Pregnancy The first trimester of pregnancy is from week 1 until the end of week 12 (months 1 through 3). A week after a sperm fertilizes an egg, the egg will implant on the wall of the uterus. This embryo will begin to develop into a baby. Genes from you and your partner are forming the baby. The female genes determine whether the baby is a boy or a girl. At 6-8 weeks, the eyes and face are formed, and the heartbeat can be seen on ultrasound. At the end of 12 weeks, all the baby's organs are formed.  Now that you are pregnant, you will want to do everything you can to have a healthy baby. Two of the most important things are to get good prenatal care and to follow your health care provider's instructions. Prenatal care is all the medical care you receive before the baby's birth. This care will help prevent, find, and treat any problems during the pregnancy and childbirth. BODY CHANGES Your body goes through many changes during pregnancy. The changes vary from woman to woman.   You may gain or lose a couple of pounds at first.  You may feel sick to your stomach (nauseous) and throw up (vomit). If the vomiting is uncontrollable, call your health care provider.  You may tire easily.  You may develop headaches that can be relieved by medicines approved by your health care provider.  You may urinate more often. Painful urination may mean you have a bladder infection.  You may develop heartburn as a result of your pregnancy.  You may develop constipation because certain hormones are causing the muscles that push waste through your intestines to slow down.  You may develop hemorrhoids or swollen, bulging veins (varicose veins).  Your breasts may begin to grow larger and become tender. Your nipples may stick out more, and the tissue that surrounds them (areola) may become darker.  Your gums may bleed and may be sensitive to brushing and flossing.  Dark spots or blotches (chloasma,  mask of pregnancy) may develop on your face. This will likely fade after the baby is born.  Your menstrual periods will stop.  You may have a loss of appetite.  You may develop cravings for certain kinds of food.  You may have changes in your emotions from day to day, such as being excited to be pregnant or being concerned that something may go wrong with the pregnancy and baby.  You may have more vivid and strange dreams.  You may have changes in your hair. These can include thickening of your hair, rapid growth, and changes in texture. Some women also have hair loss during or after pregnancy, or hair that feels dry or thin. Your hair will most likely return to normal after your baby is born. WHAT TO EXPECT AT YOUR PRENATAL VISITS During a routine prenatal visit:  You will be weighed to make sure you and the baby are growing normally.  Your blood pressure will be taken.  Your abdomen will be measured to track your baby's growth.  The fetal heartbeat will be listened to starting around week 10 or 12 of your pregnancy.  Test results from any previous visits will be discussed. Your health care provider may ask you:  How you are feeling.  If you are feeling the baby move.  If you have had any abnormal symptoms, such as leaking fluid, bleeding, severe headaches, or abdominal cramping.  If you are using any tobacco products,   including cigarettes, chewing tobacco, and electronic cigarettes.  If you have any questions. Other tests that may be performed during your first trimester include:  Blood tests to find your blood type and to check for the presence of any previous infections. They will also be used to check for low iron levels (anemia) and Rh antibodies. Later in the pregnancy, blood tests for diabetes will be done along with other tests if problems develop.  Urine tests to check for infections, diabetes, or protein in the urine.  An ultrasound to confirm the proper growth  and development of the baby.  An amniocentesis to check for possible genetic problems.  Fetal screens for spina bifida and Down syndrome.  You may need other tests to make sure you and the baby are doing well.  HIV (human immunodeficiency virus) testing. Routine prenatal testing includes screening for HIV, unless you choose not to have this test. HOME CARE INSTRUCTIONS  Medicines   Follow your health care provider's instructions regarding medicine use. Specific medicines may be either safe or unsafe to take during pregnancy.  Take your prenatal vitamins as directed.  If you develop constipation, try taking a stool softener if your health care provider approves. Diet   Eat regular, well-balanced meals. Choose a variety of foods, such as meat or vegetable-based protein, fish, milk and low-fat dairy products, vegetables, fruits, and whole grain breads and cereals. Your health care provider will help you determine the amount of weight gain that is right for you.  Avoid raw meat and uncooked cheese. These carry germs that can cause birth defects in the baby.  Eating four or five small meals rather than three large meals a day may help relieve nausea and vomiting. If you start to feel nauseous, eating a few soda crackers can be helpful. Drinking liquids between meals instead of during meals also seems to help nausea and vomiting.  If you develop constipation, eat more high-fiber foods, such as fresh vegetables or fruit and whole grains. Drink enough fluids to keep your urine clear or pale yellow. Activity and Exercise   Exercise only as directed by your health care provider. Exercising will help you:  Control your weight.  Stay in shape.  Be prepared for labor and delivery.  Experiencing pain or cramping in the lower abdomen or low back is a good sign that you should stop exercising. Check with your health care provider before continuing normal exercises.  Try to avoid standing for  long periods of time. Move your legs often if you must stand in one place for a long time.  Avoid heavy lifting.  Wear low-heeled shoes, and practice good posture.  You may continue to have sex unless your health care provider directs you otherwise. Relief of Pain or Discomfort   Wear a good support bra for breast tenderness.   Take warm sitz baths to soothe any pain or discomfort caused by hemorrhoids. Use hemorrhoid cream if your health care provider approves.   Rest with your legs elevated if you have leg cramps or low back pain.  If you develop varicose veins in your legs, wear support hose. Elevate your feet for 15 minutes, 3-4 times a day. Limit salt in your diet. Prenatal Care   Schedule your prenatal visits by the twelfth week of pregnancy. They are usually scheduled monthly at first, then more often in the last 2 months before delivery.  Write down your questions. Take them to your prenatal visits.  Keep all your prenatal  visits as directed by your health care provider. Safety   Wear your seat belt at all times when driving.  Make a list of emergency phone numbers, including numbers for family, friends, the hospital, and police and fire departments. General Tips   Ask your health care provider for a referral to a local prenatal education class. Begin classes no later than at the beginning of month 6 of your pregnancy.  Ask for help if you have counseling or nutritional needs during pregnancy. Your health care provider can offer advice or refer you to specialists for help with various needs.  Do not use hot tubs, steam rooms, or saunas.  Do not douche or use tampons or scented sanitary pads.  Do not cross your legs for long periods of time.  Avoid cat litter boxes and soil used by cats. These carry germs that can cause birth defects in the baby and possibly loss of the fetus by miscarriage or stillbirth.  Avoid all smoking, herbs, alcohol, and medicines not  prescribed by your health care provider. Chemicals in these affect the formation and growth of the baby.  Do not use any tobacco products, including cigarettes, chewing tobacco, and electronic cigarettes. If you need help quitting, ask your health care provider. You may receive counseling support and other resources to help you quit.  Schedule a dentist appointment. At home, brush your teeth with a soft toothbrush and be gentle when you floss. SEEK MEDICAL CARE IF:   You have dizziness.  You have mild pelvic cramps, pelvic pressure, or nagging pain in the abdominal area.  You have persistent nausea, vomiting, or diarrhea.  You have a bad smelling vaginal discharge.  You have pain with urination.  You notice increased swelling in your face, hands, legs, or ankles. SEEK IMMEDIATE MEDICAL CARE IF:   You have a fever.  You are leaking fluid from your vagina.  You have spotting or bleeding from your vagina.  You have severe abdominal cramping or pain.  You have rapid weight gain or loss.  You vomit blood or material that looks like coffee grounds.  You are exposed to [place] measles and have never had them.  You are exposed to fifth disease or chickenpox.  You develop a severe headache.  You have shortness of breath.  You have any kind of trauma, such as from a fall or a car accident. This information is not intended to replace advice given to you by your health care provider. Make sure you discuss any questions you have with your health care provider. Document Released: 06/15/2001 Document Revised: 07/12/2014 Document Reviewed: 05/01/2013 Elsevier Interactive Patient Education  2017 [place] often and push fluids Dating [place] in 3 weeks with intake

## 2016-06-10 NOTE — Progress Notes (Signed)
Subjective:     Patient ID: Sara Kirby, female   DOB: 1991/05/14, 25 y.o.   MRN: [id number]  HPI Sara Kirby is a 25 year old Sara Kirby female ,married in for UPT has missed a period and has had +HPT, has been trying for about 6 months.Has had some cramping on left and feels queasy in last day or so.   Review of Systems +missed period Some cramps left side Queasy Reviewed past medical,surgical, social and family history. Reviewed medications and allergies.     Objective:   Physical Exam BP 110/60 (BP Location: Left Arm, Patient Position: Sitting, Cuff Size: Normal)   Pulse 92   Ht 5\' 8"  (1.727 m)   Wt 139 lb 8 oz (63.3 kg)   LMP 05/05/2016 (Exact Date)   BMI 21.21 kg/m UPT +, about 5+1 week by LMP with EDD 02/09/17. Skin warm and dry. Neck: mid line trachea, normal thyroid, good ROM, no lymphadenopathy noted. Lungs: clear to ausculation bilaterally. Cardiovascular: regular rate and rhythm.Abdomen is soft and non tender. PHQ 2 score 0.    Assessment:     1. Pregnancy examination or test, positive result   2. Pregnancy, unspecified gestational age   47. Encounter to determine fetal viability of pregnancy, single or unspecified fetus       Plan:    Eat often and push fluids  Return in 3 weeks for dating [place] and intake  Review handout on first trimester

## 2016-06-27 ENCOUNTER — Encounter: Payer: Self-pay | Admitting: Adult Health

## 2016-06-30 ENCOUNTER — Encounter: Payer: Self-pay | Admitting: Adult Health

## 2016-07-05 NOTE — L&D Delivery Note (Signed)
Patient is 26 y.o. G1P0000 [redacted]w[redacted]d admitted SOL.  Delivery Note Upon arrival patient was complete. Noted to have late decels after contractions. Patient pushing, and despite good maternal effort, unable to deliver fetal head, and decision made for vacuum-assisted delivery. The soft vacuum soft cup was positioned over the sagittal suture 3 cm anterior to posterior fontanelle.  Pressure was then increased to 500 mmHg, and the patient was instructed to push.  Pulling was administered along the pelvic curve. Baby delivered without difficulty, was noted to have good tone and place on maternal abdomen for oral suctioning, drying and stimulation. Delayed cord clamping performed.    At 10:53 AM a viable female was delivered via Vaginal, Vacuum Neurosurgeon) (Presentation:LOA ).  APGAR: 8, 9; weight pending.   Placenta status: Intact.  Cord: 3V with the following complications: None.  Cord pH: N/A  Anesthesia: None  Episiotomy: None Lacerations: Sulcus Suture Repair: 2.0 vicryl Est. Blood Loss (mL):  150  Mom to postpartum.  Baby to Couplet care / Skin to Skin.  Vaginal canal and perineum was inspected and hemostatic. Pitocin was started and uterus massaged until bleeding slowed.  Fundus firm on exam with massage.  Counts of sharps, instruments, and lap pads were all correct.   Luiz Blare, DO [facility], [place] 01/31/2017, 11:19 AM

## 2016-07-07 ENCOUNTER — Other Ambulatory Visit: Payer: Self-pay | Admitting: Advanced Practice Midwife

## 2016-07-07 ENCOUNTER — Encounter: Payer: Self-pay | Admitting: *Deleted

## 2016-07-07 ENCOUNTER — Other Ambulatory Visit: Payer: Self-pay

## 2016-07-07 ENCOUNTER — Ambulatory Visit (INDEPENDENT_AMBULATORY_CARE_PROVIDER_SITE_OTHER): Payer: 59 | Admitting: *Deleted

## 2016-07-07 ENCOUNTER — Ambulatory Visit (INDEPENDENT_AMBULATORY_CARE_PROVIDER_SITE_OTHER): Payer: 59

## 2016-07-07 VITALS — BP 147/78 | HR 86 | Wt 140.0 lb

## 2016-07-07 DIAGNOSIS — Z3401 Encounter for supervision of normal first pregnancy, first trimester: Secondary | ICD-10-CM | POA: Diagnosis not present

## 2016-07-07 DIAGNOSIS — Z1389 Encounter for screening for other disorder: Secondary | ICD-10-CM

## 2016-07-07 DIAGNOSIS — Z3400 Encounter for supervision of normal first pregnancy, unspecified trimester: Secondary | ICD-10-CM | POA: Insufficient documentation

## 2016-07-07 DIAGNOSIS — Z3A09 9 weeks gestation of pregnancy: Secondary | ICD-10-CM | POA: Diagnosis not present

## 2016-07-07 DIAGNOSIS — Z3A01 Less than 8 weeks gestation of pregnancy: Secondary | ICD-10-CM

## 2016-07-07 DIAGNOSIS — Z3682 Encounter for antenatal screening for nuchal translucency: Secondary | ICD-10-CM

## 2016-07-07 DIAGNOSIS — Z331 Pregnant state, incidental: Secondary | ICD-10-CM

## 2016-07-07 DIAGNOSIS — O3680X0 Pregnancy with inconclusive fetal viability, not applicable or unspecified: Secondary | ICD-10-CM

## 2016-07-07 LAB — POCT URINALYSIS DIPSTICK
GLUCOSE UA: NEGATIVE
KETONES UA: NEGATIVE
Leukocytes, UA: NEGATIVE
Nitrite, UA: NEGATIVE
Protein, UA: NEGATIVE
RBC UA: NEGATIVE

## 2016-07-07 NOTE — Progress Notes (Addendum)
Sara Kirby is a 26 y.o. G35P0000 female here today for initial OB intake/educational visit with RN  Patient's medical, surgical, and obstetrical history obtained and reviewed.  Current medications and allergies also reviewed.   Dating ultrasound today revealed GA of [redacted]wks based on LMP. EDC 02/09/17   BP (!) 147/78   Pulse 86   Wt 140 lb (63.5 kg)   LMP 05/05/2016 (Exact Date)   BMI 21.29 kg/m   Patient Active Problem List   Diagnosis Date Noted  . Patient desires pregnancy 01/09/2016  . Contraceptive management 04/02/2013  . UTI (urinary tract infection) 02/27/2013   Past Medical History:  Diagnosis Date  . Contraceptive management 04/02/2013  . Patient desires pregnancy 01/09/2016  . UTI (urinary tract infection) 02/27/2013   Past Surgical History:  Procedure Laterality Date  . URETHRAL DILATION    . WISDOM TOOTH EXTRACTION     OB History    Gravida Para Term Preterm AB Living   1 0 0 0 0 0   SAB TAB Ectopic Multiple Live Births   0 0 0 0        She is taking prenatal vitamins PN1 labs drawn Baby scripts activated  Reviewed recommended weight gain based on pre-gravid BMI  Genetic Screening discussed First Screen: requested Cystic fibrosis screening discussed declined  Face-to-face time at least 30 minutes. 50% or more of this visit was spent in counseling and coordination of care.  Return in about 3 weeks (around 07/28/2016) for US;NT+1stIT, New OB.   Kristeen Miss Marvie Brevik RN-C 07/07/2016 2:44 PM   Attestation of supervision of visit:   Evaluation and management procedures were performed by Alice Rieger, RN,  under my direct supervision and collaboration. I have reviewed the Registered Nurse's note and chart, and I agree with the management and plan.  50% or more of this visit was spent in counseling and coordination of care.  10 minutes of face to face time.

## 2016-07-07 NOTE — Progress Notes (Addendum)
[place] 9 wks,single IUP w/ys,pos fht 180 bpm,normal ov's bilat,crl 28.3 mm,EDD 02/09/2017 by LMP

## 2016-07-08 DIAGNOSIS — L723 Sebaceous cyst: Secondary | ICD-10-CM | POA: Diagnosis not present

## 2016-07-08 DIAGNOSIS — D229 Melanocytic nevi, unspecified: Secondary | ICD-10-CM | POA: Diagnosis not present

## 2016-07-08 DIAGNOSIS — L739 Follicular disorder, unspecified: Secondary | ICD-10-CM | POA: Diagnosis not present

## 2016-07-08 LAB — CBC
HEMATOCRIT: 38.4 % (ref 34.0–46.6)
HEMOGLOBIN: 13.7 g/dL (ref 11.1–15.9)
MCH: 31.1 pg (ref 26.6–33.0)
MCHC: 35.7 g/dL (ref 31.5–35.7)
MCV: 87 fL (ref 79–97)
Platelets: 268 10*3/uL (ref 150–379)
RBC: 4.41 x10E6/uL (ref 3.77–5.28)
RDW: 13.2 % (ref 12.3–15.4)
WBC: 8.1 10*3/uL (ref 3.4–10.8)

## 2016-07-08 LAB — RUBELLA SCREEN: Rubella Antibodies, IGG: 3.46 index (ref >0.99)

## 2016-07-08 LAB — HIV ANTIBODY (ROUTINE TESTING W REFLEX): HIV Screen 4th Generation wRfx: NONREACTIVE

## 2016-07-08 LAB — UNABLE TO VOID

## 2016-07-08 LAB — RPR: RPR: NONREACTIVE

## 2016-07-08 LAB — HEPATITIS B SURFACE ANTIGEN: Hepatitis B Surface Ag: NEGATIVE

## 2016-07-08 LAB — ABO/RH: Rh Factor: POSITIVE

## 2016-07-08 LAB — VARICELLA ZOSTER ANTIBODY, IGG: Varicella zoster IgG: 1519 index (ref >165)

## 2016-07-08 LAB — ANTIBODY SCREEN: Antibody Screen: NEGATIVE

## 2016-07-13 LAB — PLEASE NOTE

## 2016-07-15 LAB — PMP SCREEN PROFILE (10S), URINE

## 2016-07-15 LAB — URINALYSIS, ROUTINE W REFLEX MICROSCOPIC

## 2016-07-15 LAB — SPECIMEN STATUS REPORT

## 2016-07-20 ENCOUNTER — Encounter: Payer: Self-pay | Admitting: Adult Health

## 2016-07-28 ENCOUNTER — Ambulatory Visit (INDEPENDENT_AMBULATORY_CARE_PROVIDER_SITE_OTHER): Payer: 59

## 2016-07-28 ENCOUNTER — Ambulatory Visit (INDEPENDENT_AMBULATORY_CARE_PROVIDER_SITE_OTHER): Payer: 59 | Admitting: Advanced Practice Midwife

## 2016-07-28 ENCOUNTER — Encounter: Payer: Self-pay | Admitting: Advanced Practice Midwife

## 2016-07-28 VITALS — BP 110/70 | HR 84 | Wt 141.0 lb

## 2016-07-28 DIAGNOSIS — Z3682 Encounter for antenatal screening for nuchal translucency: Secondary | ICD-10-CM

## 2016-07-28 DIAGNOSIS — Z331 Pregnant state, incidental: Secondary | ICD-10-CM

## 2016-07-28 DIAGNOSIS — Z3401 Encounter for supervision of normal first pregnancy, first trimester: Secondary | ICD-10-CM | POA: Diagnosis not present

## 2016-07-28 DIAGNOSIS — Z3A12 12 weeks gestation of pregnancy: Secondary | ICD-10-CM | POA: Diagnosis not present

## 2016-07-28 DIAGNOSIS — Z1389 Encounter for screening for other disorder: Secondary | ICD-10-CM

## 2016-07-28 LAB — POCT URINALYSIS DIPSTICK
Blood, UA: NEGATIVE
GLUCOSE UA: NEGATIVE
Ketones, UA: NEGATIVE
LEUKOCYTES UA: NEGATIVE
Nitrite, UA: NEGATIVE

## 2016-07-28 NOTE — Progress Notes (Signed)
[place] 12 wks,crl 68.7 mm,pos fht 160 bpm,normal ov's bilat,NB present,NT 1.8 mm

## 2016-07-28 NOTE — Progress Notes (Signed)
  Subjective:    Sara Kirby is a G1P0000 [redacted]w[redacted]d being seen today for her first obstetrical visit.  Her obstetrical history is significant for first pregnancy.  Pregnancy history fully reviewed.  Patient reports no complaints.  Vitals:   07/28/16 1358  BP: 110/70  Pulse: 84  Weight: 141 lb (64 kg)    HISTORY: OB History  Gravida Para Term Preterm AB Living  1 0 0 0 0 0  SAB TAB Ectopic Multiple Live Births  0 0 0 0      # Outcome Date GA Lbr Len/2nd Weight Sex Delivery Anes PTL Lv  1 Current              Past Medical History:  Diagnosis Date  . Contraceptive management 04/02/2013  . Patient desires pregnancy 01/09/2016  . UTI (urinary tract infection) 02/27/2013   Past Surgical History:  Procedure Laterality Date  . URETHRAL DILATION    . WISDOM TOOTH EXTRACTION     Family History  Problem Relation Age of Onset  . Cancer Other     uterine   . Alcohol abuse Father   . Depression Father   . Hypertension Father   . Hypertension Maternal Grandmother   . Hypertension Maternal Grandfather   . Heart disease Maternal Grandfather   . COPD Maternal Grandfather      Exam                                           Skin: normal coloration and turgor, no rashes    Neurologic: oriented, normal, normal mood   Extremities: normal strength, tone, and muscle mass   HEENT PERRLA   Mouth/Teeth mucous membranes moist, normal dentition   Neck supple and no masses   Cardiovascular: regular rate and rhythm   Respiratory:  appears well, vitals normal, no respiratory distress, acyanotic   Abdomen: soft, non-tender;  FHR: 162          Assessment:    Pregnancy: G1P0000 Patient Active Problem List   Diagnosis Date Noted  . Supervision of normal first pregnancy 07/07/2016  . Patient desires pregnancy 01/09/2016  . Contraceptive management 04/02/2013  . UTI (urinary tract infection) 02/27/2013        Plan:      Continue prenatal vitamins  Problem list  reviewed and updated  Reviewed n/v relief measures and warning s/s to report  Reviewed recommended weight gain based on pre-gravid BMI  Encouraged well-balanced diet Genetic Screening discussed Integrated Screen: discussed normal NT  Ultrasound discussed; fetal survey: requested.  Return in about 4 weeks (around 08/25/2016) for [place], 2nd IT.  CRESENZO-DISHMAN,[place] 07/28/2016

## 2016-07-29 ENCOUNTER — Other Ambulatory Visit: Payer: 59

## 2016-07-30 LAB — URINE CULTURE: Organism ID, Bacteria: NO GROWTH

## 2016-07-31 LAB — GC/CHLAMYDIA PROBE AMP
Chlamydia trachomatis, NAA: NEGATIVE
Neisseria gonorrhoeae by PCR: NEGATIVE

## 2016-08-03 ENCOUNTER — Other Ambulatory Visit: Payer: Self-pay | Admitting: Physician Assistant

## 2016-08-03 DIAGNOSIS — L723 Sebaceous cyst: Secondary | ICD-10-CM | POA: Diagnosis not present

## 2016-08-04 LAB — MATERNAL SCREEN, INTEGRATED #1
Crown Rump Length: 68.7 mm
Gest. Age on Collection Date: 12.9 weeks
Maternal Age at EDD: 27.2 years
Nuchal Translucency (NT): 1.8 mm
Number of Fetuses: 1
PAPP-A Value: 1912.9 ng/mL
Weight: 141 [lb_av]

## 2016-08-25 ENCOUNTER — Ambulatory Visit (INDEPENDENT_AMBULATORY_CARE_PROVIDER_SITE_OTHER): Payer: 59 | Admitting: Women's Health

## 2016-08-25 ENCOUNTER — Encounter: Payer: Self-pay | Admitting: Women's Health

## 2016-08-25 VITALS — BP 102/70 | HR 78 | Wt 145.0 lb

## 2016-08-25 DIAGNOSIS — Z363 Encounter for antenatal screening for malformations: Secondary | ICD-10-CM

## 2016-08-25 DIAGNOSIS — Z331 Pregnant state, incidental: Secondary | ICD-10-CM

## 2016-08-25 DIAGNOSIS — Z1389 Encounter for screening for other disorder: Secondary | ICD-10-CM

## 2016-08-25 DIAGNOSIS — Z3682 Encounter for antenatal screening for nuchal translucency: Secondary | ICD-10-CM | POA: Diagnosis not present

## 2016-08-25 DIAGNOSIS — Z3402 Encounter for supervision of normal first pregnancy, second trimester: Secondary | ICD-10-CM

## 2016-08-25 DIAGNOSIS — Z3A16 16 weeks gestation of pregnancy: Secondary | ICD-10-CM

## 2016-08-25 LAB — POCT URINALYSIS DIPSTICK
Blood, UA: NEGATIVE
Glucose, UA: NEGATIVE
KETONES UA: NEGATIVE
LEUKOCYTES UA: NEGATIVE
NITRITE UA: NEGATIVE
PROTEIN UA: NEGATIVE

## 2016-08-25 NOTE — Progress Notes (Signed)
Low-risk OB appointment G1P0000 [redacted]w[redacted]d Estimated Date of Delivery: 02/09/17 BP 102/70   Pulse 78   Wt 145 lb (65.8 kg)   LMP 05/05/2016 (Exact Date)   BMI 22.05 kg/m   BP, weight, and urine reviewed.  Refer to obstetrical flow sheet for FH & FHR.  No fm yet. Denies cramping, lof, vb, or uti s/s. Some drops in bp, at times makes her feel bad, discussed can be normal in 2nd trimester, can increase salt/fluid if it is bothersome, gave printed info on dizzy spells, can also increase protein in case more hypoglycemic in nature.  Reviewed warning s/s to report. Plan:  Continue routine obstetrical care  F/U in 4wks for OB appointment and anatomy u/s 2nd IT today

## 2016-08-25 NOTE — Patient Instructions (Addendum)
For Dizzy Spells:   This is usually related to either your blood sugar or your blood pressure dropping  Make sure you are staying well hydrated and drinking enough water so that your urine is clear  Eat small frequent meals and snacks containing protein (meat, eggs, nuts, cheese) so that your blood sugar doesn't drop  If you do get dizzy, sit/lay down and get you something to drink and a snack containing protein- you will usually start feeling better in 10-20 minutes    Second Trimester of Pregnancy The second trimester is from week 13 through week 28 (months 4 through 6). The second trimester is often a time when you feel your best. Your body has also adjusted to being pregnant, and you begin to feel better physically. Usually, morning sickness has lessened or quit completely, you may have more energy, and you may have an increase in appetite. The second trimester is also a time when the fetus is growing rapidly. At the end of the sixth month, the fetus is about 9 inches long and weighs about 1 pounds. You will likely begin to feel the baby move (quickening) between 18 and 20 weeks of the pregnancy. Body changes during your second trimester Your body continues to go through many changes during your second trimester. The changes vary from woman to woman.  Your weight will continue to increase. You will notice your lower abdomen bulging out.  You may begin to get stretch marks on your hips, abdomen, and breasts.  You may develop headaches that can be relieved by medicines. The medicines should be approved by your health care provider.  You may urinate more often because the fetus is pressing on your bladder.  You may develop or continue to have heartburn as a result of your pregnancy.  You may develop constipation because certain hormones are causing the muscles that push waste through your intestines to slow down.  You may develop hemorrhoids or swollen, bulging veins (varicose  veins).  You may have back pain. This is caused by:  Weight gain.  Pregnancy hormones that are relaxing the joints in your pelvis.  A shift in weight and the muscles that support your balance.  Your breasts will continue to grow and they will continue to become tender.  Your gums may bleed and may be sensitive to brushing and flossing.  Dark spots or blotches (chloasma, mask of pregnancy) may develop on your face. This will likely fade after the baby is born.  A dark line from your belly button to the pubic area (linea nigra) may appear. This will likely fade after the baby is born.  You may have changes in your hair. These can include thickening of your hair, rapid growth, and changes in texture. Some women also have hair loss during or after pregnancy, or hair that feels dry or thin. Your hair will most likely return to normal after your baby is born. What to expect at prenatal visits During a routine prenatal visit:  You will be weighed to make sure you and the fetus are growing normally.  Your blood pressure will be taken.  Your abdomen will be measured to track your baby's growth.  The fetal heartbeat will be listened to.  Any test results from the previous visit will be discussed. Your health care provider may ask you:  How you are feeling.  If you are feeling the baby move.  If you have had any abnormal symptoms, such as leaking fluid, bleeding, severe  headaches, or abdominal cramping.  If you are using any tobacco products, including cigarettes, chewing tobacco, and electronic cigarettes.  If you have any questions. Other tests that may be performed during your second trimester include:  Blood tests that check for:  Low iron levels (anemia).  Gestational diabetes (between 24 and 28 weeks).  Rh antibodies. This is to check for a protein on red blood cells (Rh factor).  Urine tests to check for infections, diabetes, or protein in the urine.  An ultrasound  to confirm the proper growth and development of the baby.  An amniocentesis to check for possible genetic problems.  Fetal screens for spina bifida and Down syndrome.  HIV (human immunodeficiency virus) testing. Routine prenatal testing includes screening for HIV, unless you choose not to have this test. Follow these instructions at home: Eating and drinking  Continue to eat regular, healthy meals.  Avoid raw meat, uncooked cheese, cat litter boxes, and soil used by cats. These carry germs that can cause birth defects in the baby.  Take your prenatal vitamins.  Take 1500-2000 mg of calcium daily starting at the 20th week of pregnancy until you deliver your baby.  If you develop constipation:  Take over-the-counter or prescription medicines.  Drink enough fluid to keep your urine clear or pale yellow.  Eat foods that are high in fiber, such as fresh fruits and vegetables, whole grains, and beans.  Limit foods that are high in fat and processed sugars, such as fried and sweet foods. Activity  Exercise only as directed by your health care provider. Experiencing uterine cramps is a good sign to stop exercising.  Avoid heavy lifting, wear low heel shoes, and practice good posture.  Wear your seat belt at all times when driving.  Rest with your legs elevated if you have leg cramps or low back pain.  Wear a good support bra for breast tenderness.  Do not use hot tubs, steam rooms, or saunas. Lifestyle  Avoid all smoking, herbs, alcohol, and unprescribed drugs. These chemicals affect the formation and growth of the baby.  Do not use any products that contain nicotine or tobacco, such as cigarettes and e-cigarettes. If you need help quitting, ask your health care provider.  A sexual relationship may be continued unless your health care provider directs you otherwise. General instructions  Follow your health care provider's instructions regarding medicine use. There are  medicines that are either safe or unsafe to take during pregnancy.  Take warm sitz baths to soothe any pain or discomfort caused by hemorrhoids. Use hemorrhoid cream if your health care provider approves.  If you develop varicose veins, wear support hose. Elevate your feet for 15 minutes, 3-4 times a day. Limit salt in your diet.  Visit your dentist if you have not gone yet during your pregnancy. Use a soft toothbrush to brush your teeth and be gentle when you floss.  Keep all follow-up prenatal visits as told by your health care provider. This is important. Contact a health care provider if:  You have dizziness.  You have mild pelvic cramps, pelvic pressure, or nagging pain in the abdominal area.  You have persistent nausea, vomiting, or diarrhea.  You have a bad smelling vaginal discharge.  You have pain with urination. Get help right away if:  You have a fever.  You are leaking fluid from your vagina.  You have spotting or bleeding from your vagina.  You have severe abdominal cramping or pain.  You have rapid weight  gain or weight loss.  You have shortness of breath with chest pain.  You notice sudden or extreme swelling of your face, hands, ankles, feet, or legs.  You have not felt your baby move in over an hour.  You have severe headaches that do not go away with medicine.  You have vision changes. Summary  The second trimester is from week 13 through week 28 (months 4 through 6). It is also a time when the fetus is growing rapidly.  Your body goes through many changes during pregnancy. The changes vary from woman to woman.  Avoid all smoking, herbs, alcohol, and unprescribed drugs. These chemicals affect the formation and growth your baby.  Do not use any tobacco products, such as cigarettes, chewing tobacco, and e-cigarettes. If you need help quitting, ask your health care provider.  Contact your health care provider if you have any questions. Keep all  prenatal visits as told by your health care provider. This is important. This information is not intended to replace advice given to you by your health care provider. Make sure you discuss any questions you have with your health care provider. Document Released: 06/15/2001 Document Revised: 11/27/2015 Document Reviewed: 08/22/2012 Elsevier Interactive Patient Education  2017 [employer].

## 2016-08-31 LAB — MATERNAL SCREEN, INTEGRATED #2
AFP MARKER: 45.5 ng/mL
AFP MOM: 1.26
CROWN RUMP LENGTH: 68.7 mm
DIA MoM: 2.23
DIA Value: 401.6 pg/mL
ESTRIOL UNCONJUGATED: 0.85 ng/mL
GESTATIONAL AGE: 16.9 wk
Gest. Age on Collection Date: 12.9 weeks
Maternal Age at EDD: 27.2 years
Nuchal Translucency (NT): 1.8 mm
Nuchal Translucency MoM: 1.06
Number of Fetuses: 1
PAPP-A MoM: 1.63
PAPP-A Value: 1912.9 ng/mL
TEST RESULTS: NEGATIVE
WEIGHT: 141 [lb_av]
Weight: 141 [lb_av]
hCG MoM: 3.92
hCG Value: 117.4 IU/mL
uE3 MoM: 0.86

## 2016-09-21 ENCOUNTER — Encounter: Payer: Self-pay | Admitting: Obstetrics & Gynecology

## 2016-09-21 ENCOUNTER — Ambulatory Visit (INDEPENDENT_AMBULATORY_CARE_PROVIDER_SITE_OTHER): Payer: 59

## 2016-09-21 ENCOUNTER — Ambulatory Visit (INDEPENDENT_AMBULATORY_CARE_PROVIDER_SITE_OTHER): Payer: 59 | Admitting: Obstetrics & Gynecology

## 2016-09-21 VITALS — BP 100/68 | HR 90 | Wt 152.0 lb

## 2016-09-21 DIAGNOSIS — Z363 Encounter for antenatal screening for malformations: Secondary | ICD-10-CM

## 2016-09-21 DIAGNOSIS — Z1389 Encounter for screening for other disorder: Secondary | ICD-10-CM

## 2016-09-21 DIAGNOSIS — Z3402 Encounter for supervision of normal first pregnancy, second trimester: Secondary | ICD-10-CM

## 2016-09-21 DIAGNOSIS — Z331 Pregnant state, incidental: Secondary | ICD-10-CM

## 2016-09-21 LAB — POCT URINALYSIS DIPSTICK
Blood, UA: NEGATIVE
Glucose, UA: NEGATIVE
Ketones, UA: NEGATIVE
Leukocytes, UA: NEGATIVE
Nitrite, UA: NEGATIVE
PROTEIN UA: NEGATIVE

## 2016-09-21 NOTE — Progress Notes (Signed)
G1P0000 [redacted]w[redacted]d Estimated Date of Delivery: 02/09/17  Blood pressure 100/68, pulse 90, weight 152 lb (68.9 kg), last menstrual period 05/05/2016.   BP weight and urine results all reviewed and noted.  Please refer to the obstetrical flow sheet for the fundal height and fetal heart rate documentation:  Patient reports good fetal movement, denies any bleeding and no rupture of membranes symptoms or regular contractions. Patient is without complaints. All questions were answered.  Orders Placed This Encounter  Procedures  . POCT urinalysis dipstick    Plan:  Continued routine obstetrical care, sonogram is normal see report  Return in about 4 weeks (around 10/19/2016) for LROB.

## 2016-09-21 NOTE — Progress Notes (Signed)
[place] 19+6 wks,breech,cx 3.9 cm,normal ov's bilat,post pl gr 0,fhr 152 bpm,svp of fluid 5.2 cm,efw 374 g ,anatomy complete,no obvious abnormalities seen

## 2016-10-16 ENCOUNTER — Encounter: Payer: Self-pay | Admitting: Adult Health

## 2016-10-19 ENCOUNTER — Ambulatory Visit (INDEPENDENT_AMBULATORY_CARE_PROVIDER_SITE_OTHER): Payer: 59 | Admitting: Obstetrics & Gynecology

## 2016-10-19 ENCOUNTER — Encounter: Payer: Self-pay | Admitting: Obstetrics & Gynecology

## 2016-10-19 VITALS — BP 110/60 | HR 74 | Wt 155.0 lb

## 2016-10-19 DIAGNOSIS — Z3402 Encounter for supervision of normal first pregnancy, second trimester: Secondary | ICD-10-CM

## 2016-10-19 DIAGNOSIS — Z331 Pregnant state, incidental: Secondary | ICD-10-CM

## 2016-10-19 DIAGNOSIS — Z1389 Encounter for screening for other disorder: Secondary | ICD-10-CM

## 2016-10-19 LAB — POCT URINALYSIS DIPSTICK
GLUCOSE UA: NEGATIVE
Leukocytes, UA: NEGATIVE
Nitrite, UA: NEGATIVE
Protein, UA: NEGATIVE
RBC UA: NEGATIVE

## 2016-10-19 NOTE — Progress Notes (Signed)
G1P0000 [redacted]w[redacted]d Estimated Date of Delivery: 02/09/17  Blood pressure 110/60, pulse 74, weight 155 lb (70.3 kg), last menstrual period 05/05/2016.   BP weight and urine results all reviewed and noted.  Please refer to the obstetrical flow sheet for the fundal height and fetal heart rate documentation:  Patient reports good fetal movement, denies any bleeding and no rupture of membranes symptoms or regular contractions. Patient is without complaints. All questions were answered.  Orders Placed This Encounter  Procedures  . POCT urinalysis dipstick    Plan:  Continued routine obstetrical care, PN2 next vist  Return in about 4 weeks (around 11/16/2016) for PN2, , LROB.

## 2016-11-19 ENCOUNTER — Other Ambulatory Visit: Payer: 59

## 2016-11-19 ENCOUNTER — Encounter: Payer: Self-pay | Admitting: Obstetrics & Gynecology

## 2016-11-19 ENCOUNTER — Ambulatory Visit (INDEPENDENT_AMBULATORY_CARE_PROVIDER_SITE_OTHER): Payer: 59 | Admitting: Obstetrics & Gynecology

## 2016-11-19 VITALS — BP 122/84 | HR 83 | Wt 165.0 lb

## 2016-11-19 DIAGNOSIS — Z3403 Encounter for supervision of normal first pregnancy, third trimester: Secondary | ICD-10-CM

## 2016-11-19 DIAGNOSIS — Z1389 Encounter for screening for other disorder: Secondary | ICD-10-CM

## 2016-11-19 DIAGNOSIS — Z3402 Encounter for supervision of normal first pregnancy, second trimester: Secondary | ICD-10-CM | POA: Diagnosis not present

## 2016-11-19 DIAGNOSIS — Z3A28 28 weeks gestation of pregnancy: Secondary | ICD-10-CM | POA: Diagnosis not present

## 2016-11-19 DIAGNOSIS — Z131 Encounter for screening for diabetes mellitus: Secondary | ICD-10-CM

## 2016-11-19 DIAGNOSIS — Z331 Pregnant state, incidental: Secondary | ICD-10-CM

## 2016-11-19 LAB — POCT URINALYSIS DIPSTICK
GLUCOSE UA: NEGATIVE
Ketones, UA: NEGATIVE
Leukocytes, UA: NEGATIVE
NITRITE UA: NEGATIVE
PROTEIN UA: NEGATIVE
RBC UA: NEGATIVE

## 2016-11-19 NOTE — Progress Notes (Signed)
G1P0000 [redacted]w[redacted]d Estimated Date of Delivery: 02/09/17  Blood pressure 122/84, pulse 83, weight 165 lb (74.8 kg), last menstrual period 05/05/2016.   BP weight and urine results all reviewed and noted.  Please refer to the obstetrical flow sheet for the fundal height and fetal heart rate documentation:  Patient reports good fetal movement, denies any bleeding and no rupture of membranes symptoms or regular contractions. Patient is without complaints. All questions were answered.  Orders Placed This Encounter  Procedures  . POCT urinalysis dipstick    Plan:  Continued routine obstetrical care, PN2 today  Return in about 3 weeks (around 12/10/2016) for LROB.

## 2016-11-20 LAB — CBC
Hematocrit: 35 % (ref 34.0–46.6)
Hemoglobin: 11.5 g/dL (ref 11.1–15.9)
MCH: 31.7 pg (ref 26.6–33.0)
MCHC: 32.9 g/dL (ref 31.5–35.7)
MCV: 96 fL (ref 79–97)
PLATELETS: 258 10*3/uL (ref 150–379)
RBC: 3.63 x10E6/uL — AB (ref 3.77–5.28)
RDW: 13.3 % (ref 12.3–15.4)
WBC: 11.2 10*3/uL — AB (ref 3.4–10.8)

## 2016-11-20 LAB — HIV ANTIBODY (ROUTINE TESTING W REFLEX): HIV SCREEN 4TH GENERATION: NONREACTIVE

## 2016-11-20 LAB — GLUCOSE TOLERANCE, 2 HOURS W/ 1HR
GLUCOSE, 2 HOUR: 115 mg/dL (ref 65–152)
GLUCOSE, FASTING: 77 mg/dL (ref 65–91)
Glucose, 1 hour: 120 mg/dL (ref 65–179)

## 2016-11-20 LAB — ANTIBODY SCREEN: ANTIBODY SCREEN: NEGATIVE

## 2016-11-20 LAB — RPR: RPR Ser Ql: NONREACTIVE

## 2016-12-10 ENCOUNTER — Encounter: Payer: Self-pay | Admitting: Obstetrics and Gynecology

## 2016-12-10 ENCOUNTER — Ambulatory Visit (INDEPENDENT_AMBULATORY_CARE_PROVIDER_SITE_OTHER): Payer: 59 | Admitting: Obstetrics and Gynecology

## 2016-12-10 VITALS — BP 122/74 | HR 84 | Wt 166.2 lb

## 2016-12-10 DIAGNOSIS — K219 Gastro-esophageal reflux disease without esophagitis: Secondary | ICD-10-CM

## 2016-12-10 DIAGNOSIS — Z331 Pregnant state, incidental: Secondary | ICD-10-CM

## 2016-12-10 DIAGNOSIS — Z1389 Encounter for screening for other disorder: Secondary | ICD-10-CM

## 2016-12-10 DIAGNOSIS — Z3403 Encounter for supervision of normal first pregnancy, third trimester: Secondary | ICD-10-CM

## 2016-12-10 LAB — POCT URINALYSIS DIPSTICK
GLUCOSE UA: NEGATIVE
Ketones, UA: NEGATIVE
LEUKOCYTES UA: NEGATIVE
NITRITE UA: NEGATIVE
Protein, UA: NEGATIVE
RBC UA: NEGATIVE

## 2016-12-10 MED ORDER — OMEPRAZOLE 20 MG PO CPDR: 20.0000 mg | DELAYED_RELEASE_CAPSULE | Freq: Every day | ORAL | 3 refills | Status: DC

## 2016-12-10 MED FILL — OMEPRAZOLE DR 20 MG CAPSULE: 20 | 30 days supply | Qty: 30 | Fill #0

## 2016-12-10 NOTE — Progress Notes (Signed)
Sara Kirby is a 26 y.o. female   G1P0000  Estimated Date of Delivery: 02/09/17 LROB [redacted]w[redacted]d  Chief Complaint  Patient presents with  . Routine Prenatal Visit    Patient complaints: acid reflux. She has been taking tums with mild temporary relief. She has no other acute complaints or symptoms at this time. Patient reports good fetal movement; denies any bleeding, rupture of membranes,or regular contractions.  Blood pressure 122/74, pulse 84, weight 166 lb 3.2 oz (75.4 kg), last menstrual period 05/05/2016.   Urine results: negative refer to the ob flow sheet for FH and FHR, ,                          Physical Examination: General appearance - alert, well appearing, and in no distress                                      Abdomen - FH 30 cm ,                                                         -FHR 144 bpm                                                         soft, nontender, nondistended, no masses or organomegaly                                      Pelvic - examination not indicated                                            Questions were answered. Assessment: LROB G1P0000 @ [redacted]w[redacted]d  Plan:  Continued routine obstetrical care. Will also discharge with Rx for omeprazole  F/u in 2 weeks for routine OB care.  By signing my name below, I, Evelene Croon, attest that this documentation has been prepared under the direction and in the presence of Jonnie Kind, MD . Electronically Signed: Evelene Croon, Scribe. 12/10/2016. 1:25 PM. I personally performed the services described in this documentation, which was SCRIBED in my presence. The recorded information has been reviewed and considered accurate. It has been edited as necessary during review. Jonnie Kind, MD

## 2016-12-16 ENCOUNTER — Encounter: Payer: Self-pay | Admitting: *Deleted

## 2016-12-23 ENCOUNTER — Ambulatory Visit (INDEPENDENT_AMBULATORY_CARE_PROVIDER_SITE_OTHER): Payer: 59 | Admitting: Women's Health

## 2016-12-23 ENCOUNTER — Encounter: Payer: Self-pay | Admitting: Women's Health

## 2016-12-23 VITALS — BP 110/60 | HR 85 | Wt 166.0 lb

## 2016-12-23 DIAGNOSIS — Z1389 Encounter for screening for other disorder: Secondary | ICD-10-CM

## 2016-12-23 DIAGNOSIS — Z331 Pregnant state, incidental: Secondary | ICD-10-CM

## 2016-12-23 DIAGNOSIS — Z3403 Encounter for supervision of normal first pregnancy, third trimester: Secondary | ICD-10-CM

## 2016-12-23 DIAGNOSIS — Z029 Encounter for administrative examinations, unspecified: Secondary | ICD-10-CM

## 2016-12-23 LAB — POCT URINALYSIS DIPSTICK
GLUCOSE UA: NEGATIVE
KETONES UA: NEGATIVE
LEUKOCYTES UA: NEGATIVE
NITRITE UA: NEGATIVE
Protein, UA: NEGATIVE
RBC UA: NEGATIVE

## 2016-12-23 NOTE — Patient Instructions (Signed)
Call the office [phone]) or go to [facility] if:  You begin to have strong, frequent contractions  Your water breaks.  Sometimes it is a big gush of fluid, sometimes it is just a trickle that keeps getting your panties wet or running down your legs  You have vaginal bleeding.  It is normal to have a small amount of spotting if your cervix was checked.   You don't feel your baby moving like normal.  If you don't, get you something to eat and drink and lay down and focus on feeling your baby move.  You should feel at least 10 movements in 2 hours.  If you don't, you should call the office or go to [facility].    Tdap Vaccine  It is recommended that you get the Tdap vaccine during the third trimester of EACH pregnancy to help protect your baby from getting pertussis (whooping cough)  27-36 weeks is the BEST time to do this so that you can pass the protection on to your baby. During pregnancy is better than after pregnancy, but if you are unable to get it during pregnancy it will be offered at the hospital.   You can get this vaccine at the health department or your family doctor  Everyone who will be around your baby should also be up-to-date on their vaccines. Adults (who are not pregnant) only need 1 dose of Tdap during adulthood.     Preterm Labor and Birth Information The normal length of a pregnancy is 39-41 weeks. Preterm labor is when labor starts before 37 completed weeks of pregnancy. What are the risk factors for preterm labor? Preterm labor is more likely to occur in women who:  Have certain infections during pregnancy such as a bladder infection, sexually transmitted infection, or infection inside the uterus (chorioamnionitis).  Have a shorter-than-normal cervix.  Have gone into preterm labor before.  Have had surgery on their cervix.  Are younger than age 41 or older than age 23.  Are African American.  Are pregnant with twins or multiple babies (multiple  gestation).  Take street drugs or smoke while pregnant.  Do not gain enough weight while pregnant.  Became pregnant shortly after having been pregnant.  What are the symptoms of preterm labor? Symptoms of preterm labor include:  Cramps similar to those that can happen during a menstrual period. The cramps may happen with diarrhea.  Pain in the abdomen or lower back.  Regular uterine contractions that may feel like tightening of the abdomen.  A feeling of increased pressure in the pelvis.  Increased watery or bloody mucus discharge from the vagina.  Water breaking (ruptured amniotic sac).  Why is it important to recognize signs of preterm labor? It is important to recognize signs of preterm labor because babies who are born prematurely may not be fully developed. This can put them at an increased risk for:  Long-term (chronic) heart and lung problems.  Difficulty immediately after birth with regulating body systems, including blood sugar, body temperature, heart rate, and breathing rate.  Bleeding in the brain.  Cerebral palsy.  Learning difficulties.  Death.  These risks are highest for babies who are born before 45 weeks of pregnancy. How is preterm labor treated? Treatment depends on the length of your pregnancy, your condition, and the health of your baby. It may involve:  Having a stitch (suture) placed in your cervix to prevent your cervix from opening too early (cerclage).  Taking or being given medicines,  such as: ? Hormone medicines. These may be given early in pregnancy to help support the pregnancy. ? Medicine to stop contractions. ? Medicines to help mature the baby's lungs. These may be prescribed if the risk of delivery is high. ? Medicines to prevent your baby from developing cerebral palsy.  If the labor happens before 34 weeks of pregnancy, you may need to stay in the hospital. What should I do if I think I am in preterm labor? If you think that you  are going into preterm labor, call your health care provider right away. How can I prevent preterm labor in future pregnancies? To increase your chance of having a full-term pregnancy:  Do not use any tobacco products, such as cigarettes, chewing tobacco, and e-cigarettes. If you need help quitting, ask your health care provider.  Do not use street drugs or medicines that have not been prescribed to you during your pregnancy.  Talk with your health care provider before taking any herbal supplements, even if you have been taking them regularly.  Make sure you gain a healthy amount of weight during your pregnancy.  Watch for infection. If you think that you might have an infection, get it checked right away.  Make sure to tell your health care provider if you have gone into preterm labor before.  This information is not intended to replace advice given to you by your health care provider. Make sure you discuss any questions you have with your health care provider. Document Released: 09/11/2003 Document Revised: 12/02/2015 Document Reviewed: 11/12/2015 Elsevier Interactive Patient Education  2018 [employer].

## 2016-12-23 NOTE — Progress Notes (Signed)
Low-risk OB appointment G1P0000 104w1d Estimated Date of Delivery: 02/09/17 BP 110/60   Pulse 85   Wt 166 lb (75.3 kg)   LMP 05/05/2016 (Exact Date)   BMI 25.24 kg/m   BP, weight, and urine reviewed.  Refer to obstetrical flow sheet for FH & FHR.  Reports good fm.  Denies regular uc's, lof, vb, or uti s/s. No complaints. Reviewed ptl s/s, fkc. Recommended Tdap at HD/PCP per CDC guidelines.  Plan:  Continue routine obstetrical care  F/U in 2wks for OB appointment

## 2017-01-07 ENCOUNTER — Ambulatory Visit (INDEPENDENT_AMBULATORY_CARE_PROVIDER_SITE_OTHER): Payer: 59 | Admitting: Women's Health

## 2017-01-07 ENCOUNTER — Encounter: Payer: Self-pay | Admitting: Women's Health

## 2017-01-07 VITALS — BP 120/70 | HR 80 | Wt 168.0 lb

## 2017-01-07 DIAGNOSIS — Z3A35 35 weeks gestation of pregnancy: Secondary | ICD-10-CM

## 2017-01-07 DIAGNOSIS — Z331 Pregnant state, incidental: Secondary | ICD-10-CM

## 2017-01-07 DIAGNOSIS — Z1389 Encounter for screening for other disorder: Secondary | ICD-10-CM

## 2017-01-07 DIAGNOSIS — Z3403 Encounter for supervision of normal first pregnancy, third trimester: Secondary | ICD-10-CM

## 2017-01-07 DIAGNOSIS — O26843 Uterine size-date discrepancy, third trimester: Secondary | ICD-10-CM

## 2017-01-07 LAB — POCT URINALYSIS DIPSTICK
Blood, UA: NEGATIVE
GLUCOSE UA: NEGATIVE
Ketones, UA: NEGATIVE
Leukocytes, UA: NEGATIVE
Nitrite, UA: NEGATIVE
Protein, UA: NEGATIVE

## 2017-01-07 NOTE — Patient Instructions (Signed)
Call the office ([phone]) or go to [facility] if:  You begin to have strong, frequent contractions  Your water breaks.  Sometimes it is a big gush of fluid, sometimes it is just a trickle that keeps getting your panties wet or running down your legs  You have vaginal bleeding.  It is normal to have a small amount of spotting if your cervix was checked.   You don't feel your baby moving like normal.  If you don't, get you something to eat and drink and lay down and focus on feeling your baby move.  You should feel at least 10 movements in 2 hours.  If you don't, you should call the office or go to [facility].     Preterm Labor and Birth Information The normal length of a pregnancy is 39-41 weeks. Preterm labor is when labor starts before 37 completed weeks of pregnancy. What are the risk factors for preterm labor? Preterm labor is more likely to occur in women who:  Have certain infections during pregnancy such as a bladder infection, sexually transmitted infection, or infection inside the uterus (chorioamnionitis).  Have a shorter-than-normal cervix.  Have gone into preterm labor before.  Have had surgery on their cervix.  Are younger than age 17 or older than age 35.  Are African American.  Are pregnant with twins or multiple babies (multiple gestation).  Take street drugs or smoke while pregnant.  Do not gain enough weight while pregnant.  Became pregnant shortly after having been pregnant.  What are the symptoms of preterm labor? Symptoms of preterm labor include:  Cramps similar to those that can happen during a menstrual period. The cramps may happen with diarrhea.  Pain in the abdomen or lower back.  Regular uterine contractions that may feel like tightening of the abdomen.  A feeling of increased pressure in the pelvis.  Increased watery or bloody mucus discharge from the vagina.  Water breaking (ruptured amniotic sac).  Why is it important to  recognize signs of preterm labor? It is important to recognize signs of preterm labor because babies who are born prematurely may not be fully developed. This can put them at an increased risk for:  Long-term (chronic) heart and lung problems.  Difficulty immediately after birth with regulating body systems, including blood sugar, body temperature, heart rate, and breathing rate.  Bleeding in the brain.  Cerebral palsy.  Learning difficulties.  Death.  These risks are highest for babies who are born before 34 weeks of pregnancy. How is preterm labor treated? Treatment depends on the length of your pregnancy, your condition, and the health of your baby. It may involve:  Having a stitch (suture) placed in your cervix to prevent your cervix from opening too early (cerclage).  Taking or being given medicines, such as: ? Hormone medicines. These may be given early in pregnancy to help support the pregnancy. ? Medicine to stop contractions. ? Medicines to help mature the baby's lungs. These may be prescribed if the risk of delivery is high. ? Medicines to prevent your baby from developing cerebral palsy.  If the labor happens before 34 weeks of pregnancy, you may need to stay in the hospital. What should I do if I think I am in preterm labor? If you think that you are going into preterm labor, call your health care provider right away. How can I prevent preterm labor in future pregnancies? To increase your chance of having a full-term pregnancy:  Do not use   any tobacco products, such as cigarettes, chewing tobacco, and e-cigarettes. If you need help quitting, ask your health care provider.  Do not use street drugs or medicines that have not been prescribed to you during your pregnancy.  Talk with your health care provider before taking any herbal supplements, even if you have been taking them regularly.  Make sure you gain a healthy amount of weight during your pregnancy.  Watch  for infection. If you think that you might have an infection, get it checked right away.  Make sure to tell your health care provider if you have gone into preterm labor before.  This information is not intended to replace advice given to you by your health care provider. Make sure you discuss any questions you have with your health care provider. Document Released: 09/11/2003 Document Revised: 12/02/2015 Document Reviewed: 11/12/2015 Elsevier Interactive Patient Education  2018 [employer]  

## 2017-01-07 NOTE — Progress Notes (Signed)
Low-risk OB appointment G1P0000 [redacted]w[redacted]d Estimated Date of Delivery: 02/09/17 BP 120/70   Pulse 80   Wt 168 lb (76.2 kg)   LMP 05/05/2016 (Exact Date)   BMI 25.54 kg/m   BP, weight, and urine reviewed.  Refer to obstetrical flow sheet for FH & FHR.  Reports good fm.  Denies regular uc's, lof, vb, or uti s/s. No complaints. Reviewed ptl s/s, fkc. Plan:  Continue routine obstetrical care  F/U asap for efw/afi u/s d/t s<d (no visit), then 1wk for OB appointment

## 2017-01-11 ENCOUNTER — Other Ambulatory Visit: Payer: Self-pay | Admitting: Women's Health

## 2017-01-11 ENCOUNTER — Encounter: Payer: Self-pay | Admitting: Women's Health

## 2017-01-11 DIAGNOSIS — Z0000 Encounter for general adult medical examination without abnormal findings: Secondary | ICD-10-CM | POA: Diagnosis not present

## 2017-01-11 MED FILL — OMEPRAZOLE DR 20 MG CAPSULE: 20 | 30 days supply | Qty: 30 | Fill #1

## 2017-01-12 ENCOUNTER — Ambulatory Visit (INDEPENDENT_AMBULATORY_CARE_PROVIDER_SITE_OTHER): Payer: 59

## 2017-01-12 DIAGNOSIS — Z3403 Encounter for supervision of normal first pregnancy, third trimester: Secondary | ICD-10-CM

## 2017-01-12 DIAGNOSIS — O26843 Uterine size-date discrepancy, third trimester: Secondary | ICD-10-CM | POA: Diagnosis not present

## 2017-01-12 NOTE — Progress Notes (Signed)
[place] 36 wks,cephalic,post pl gr 3,afi 9 cm,fhr 155 bpm,EFW 2971 g  59%

## 2017-01-17 ENCOUNTER — Ambulatory Visit (INDEPENDENT_AMBULATORY_CARE_PROVIDER_SITE_OTHER): Payer: 59 | Admitting: Women's Health

## 2017-01-17 ENCOUNTER — Encounter: Payer: Self-pay | Admitting: Women's Health

## 2017-01-17 VITALS — BP 104/68 | HR 99 | Wt 169.0 lb

## 2017-01-17 DIAGNOSIS — Z331 Pregnant state, incidental: Secondary | ICD-10-CM

## 2017-01-17 DIAGNOSIS — Z1389 Encounter for screening for other disorder: Secondary | ICD-10-CM

## 2017-01-17 DIAGNOSIS — Z3A36 36 weeks gestation of pregnancy: Secondary | ICD-10-CM | POA: Diagnosis not present

## 2017-01-17 DIAGNOSIS — Z3403 Encounter for supervision of normal first pregnancy, third trimester: Secondary | ICD-10-CM

## 2017-01-17 LAB — POCT URINALYSIS DIPSTICK
Glucose, UA: NEGATIVE
Ketones, UA: NEGATIVE
Nitrite, UA: NEGATIVE
PROTEIN UA: NEGATIVE
RBC UA: NEGATIVE

## 2017-01-17 LAB — OB RESULTS CONSOLE GBS: STREP GROUP B AG: NEGATIVE

## 2017-01-17 NOTE — Patient Instructions (Signed)
Call the office ([phone]) or go to [facility] if:  You begin to have strong, frequent contractions  Your water breaks.  Sometimes it is a big gush of fluid, sometimes it is just a trickle that keeps getting your panties wet or running down your legs  You have vaginal bleeding.  It is normal to have a small amount of spotting if your cervix was checked.   You don't feel your baby moving like normal.  If you don't, get you something to eat and drink and lay down and focus on feeling your baby move.  You should feel at least 10 movements in 2 hours.  If you don't, you should call the office or go to [facility].     Braxton Hicks Contractions Contractions of the uterus can occur throughout pregnancy, but they are not always a sign that you are in labor. You may have practice contractions called Braxton Hicks contractions. These false labor contractions are sometimes confused with true labor. What are Braxton Hicks contractions? Braxton Hicks contractions are tightening movements that occur in the muscles of the uterus before labor. Unlike true labor contractions, these contractions do not result in opening (dilation) and thinning of the cervix. Toward the end of pregnancy (32-34 weeks), Braxton Hicks contractions can happen more often and may become stronger. These contractions are sometimes difficult to tell apart from true labor because they can be very uncomfortable. You should not feel embarrassed if you go to the hospital with false labor. Sometimes, the only way to tell if you are in true labor is for your health care provider to look for changes in the cervix. The health care provider will do a physical exam and may monitor your contractions. If you are not in true labor, the exam should show that your cervix is not dilating and your water has not broken. If there are no prenatal problems or other health problems associated with your pregnancy, it is completely safe for you to be sent  home with false labor. You may continue to have Braxton Hicks contractions until you go into true labor. How can I tell the difference between true labor and false labor?  Differences ? False labor ? Contractions last 30-70 seconds.: Contractions are usually shorter and not as strong as true labor contractions. ? Contractions become very regular.: Contractions are usually irregular. ? Discomfort is usually felt in the top of the uterus, and it spreads to the lower abdomen and low back.: Contractions are often felt in the front of the lower abdomen and in the groin. ? Contractions do not go away with walking.: Contractions may go away when you walk around or change positions while lying down. ? Contractions usually become more intense and increase in frequency.: Contractions get weaker and are shorter-lasting as time goes on. ? The cervix dilates and gets thinner.: The cervix usually does not dilate or become thin. Follow these instructions at home:  Take over-the-counter and prescription medicines only as told by your health care provider.  Keep up with your usual exercises and follow other instructions from your health care provider.  Eat and drink lightly if you think you are going into labor.  If Braxton Hicks contractions are making you uncomfortable: ? Change your position from lying down or resting to walking, or change from walking to resting. ? Sit and rest in a tub of warm water. ? Drink enough fluid to keep your urine clear or pale yellow. Dehydration may cause these contractions. ?   Do slow and deep breathing several times an hour.  Keep all follow-up prenatal visits as told by your health care provider. This is important. Contact a health care provider if:  You have a fever.  You have continuous pain in your abdomen. Get help right away if:  Your contractions become stronger, more regular, and closer together.  You have fluid leaking or gushing from your vagina.  You  pass blood-tinged mucus (bloody show).  You have bleeding from your vagina.  You have low back pain that you never had before.  You feel your baby's head pushing down and causing pelvic pressure.  Your baby is not moving inside you as much as it used to. Summary  Contractions that occur before labor are called Braxton Hicks contractions, false labor, or practice contractions.  Braxton Hicks contractions are usually shorter, weaker, farther apart, and less regular than true labor contractions. True labor contractions usually become progressively stronger and regular and they become more frequent.  Manage discomfort from Braxton Hicks contractions by changing position, resting in a warm bath, drinking plenty of water, or practicing deep breathing. This information is not intended to replace advice given to you by your health care provider. Make sure you discuss any questions you have with your health care provider. Document Released: 06/21/2005 Document Revised: 05/10/2016 Document Reviewed: 05/10/2016 Elsevier Interactive Patient Education  2017 [employer]  

## 2017-01-17 NOTE — Progress Notes (Signed)
Low-risk OB appointment G1P0000 [redacted]w[redacted]d Estimated Date of Delivery: 02/09/17 BP 104/68   Pulse 99   Wt 169 lb (76.7 kg)   LMP 05/05/2016 (Exact Date)   BMI 25.70 kg/m   BP, weight, and urine reviewed.  Refer to obstetrical flow sheet for FH & FHR.  Reports good fm.  Denies regular uc's, lof, vb, or uti s/s. No complaints. S<D, efw/afi u/s 7/11 for same: efw 59%/afi 9cm, vtx GBS, gc/ct collected SVE per request: 1.5/80/-2, vtx Reviewed labor s/s, fkc. Plan:  Continue routine obstetrical care  F/U in 1wk for OB appointment

## 2017-01-20 LAB — GC/CHLAMYDIA PROBE AMP
CHLAMYDIA, DNA PROBE: NEGATIVE
Neisseria gonorrhoeae by PCR: NEGATIVE

## 2017-01-21 LAB — CULTURE, BETA STREP (GROUP B ONLY): Strep Gp B Culture: NEGATIVE

## 2017-01-25 ENCOUNTER — Ambulatory Visit (INDEPENDENT_AMBULATORY_CARE_PROVIDER_SITE_OTHER): Payer: 59 | Admitting: Obstetrics & Gynecology

## 2017-01-25 ENCOUNTER — Encounter: Payer: Self-pay | Admitting: Obstetrics & Gynecology

## 2017-01-25 VITALS — BP 112/70 | HR 87 | Wt 172.0 lb

## 2017-01-25 DIAGNOSIS — Z331 Pregnant state, incidental: Secondary | ICD-10-CM

## 2017-01-25 DIAGNOSIS — Z3403 Encounter for supervision of normal first pregnancy, third trimester: Secondary | ICD-10-CM | POA: Diagnosis not present

## 2017-01-25 DIAGNOSIS — Z3A37 37 weeks gestation of pregnancy: Secondary | ICD-10-CM

## 2017-01-25 DIAGNOSIS — Z1389 Encounter for screening for other disorder: Secondary | ICD-10-CM

## 2017-01-25 LAB — POCT URINALYSIS DIPSTICK
Glucose, UA: NEGATIVE
Ketones, UA: NEGATIVE
NITRITE UA: NEGATIVE
PROTEIN UA: NEGATIVE
RBC UA: NEGATIVE

## 2017-01-25 NOTE — Progress Notes (Signed)
G1P0000 100w6d Estimated Date of Delivery: 02/09/17  Blood pressure 112/70, pulse 87, weight 172 lb (78 kg), last menstrual period 05/05/2016.   BP weight and urine results all reviewed and noted.  Please refer to the obstetrical flow sheet for the fundal height and fetal heart rate documentation:  Patient reports good fetal movement, denies any bleeding and no rupture of membranes symptoms or regular contractions. Patient is without complaints. All questions were answered.  Orders Placed This Encounter  Procedures  . POCT urinalysis dipstick    Plan:  Continued routine obstetrical care,  Cervix 2-3/80/-2/soft/mid  Return in about 1 week (around 02/01/2017) for LROB.

## 2017-01-31 ENCOUNTER — Encounter (HOSPITAL_COMMUNITY): Payer: Self-pay

## 2017-01-31 ENCOUNTER — Inpatient Hospital Stay (HOSPITAL_COMMUNITY)
Admission: AD | Admit: 2017-01-31 | Discharge: 2017-02-01 | DRG: 775 | Disposition: A | Discharge status: Home / Self Care | Payer: 59 | Source: Ambulatory Visit | Attending: Obstetrics & Gynecology | Admitting: Obstetrics & Gynecology

## 2017-01-31 DIAGNOSIS — Z3493 Encounter for supervision of normal pregnancy, unspecified, third trimester: Secondary | ICD-10-CM | POA: Diagnosis present

## 2017-01-31 DIAGNOSIS — Z3A38 38 weeks gestation of pregnancy: Secondary | ICD-10-CM

## 2017-01-31 LAB — TYPE AND SCREEN
ABO/RH(D): B POS
ANTIBODY SCREEN: NEGATIVE

## 2017-01-31 LAB — CBC
HEMATOCRIT: 33.8 % — AB (ref 36.0–46.0)
Hemoglobin: 11.5 g/dL — ABNORMAL LOW (ref 12.0–15.0)
MCH: 30.3 pg (ref 26.0–34.0)
MCHC: 34 g/dL (ref 30.0–36.0)
MCV: 89.2 fL (ref 78.0–100.0)
Platelets: 348 10*3/uL (ref 150–400)
RBC: 3.79 MIL/uL — ABNORMAL LOW (ref 3.87–5.11)
RDW: 13.8 % (ref 11.5–15.5)
WBC: 16.7 10*3/uL — AB (ref 4.0–10.5)

## 2017-01-31 LAB — POCT FERN TEST: POCT Fern Test: POSITIVE

## 2017-01-31 LAB — ABO/RH: ABO/RH(D): B POS

## 2017-01-31 LAB — RPR: RPR Ser Ql: NONREACTIVE

## 2017-01-31 MED ORDER — SOD CITRATE-CITRIC ACID 500-334 MG/5ML PO SOLN: 30.0000 mL | ORAL | Status: DC | PRN

## 2017-01-31 MED ORDER — OXYCODONE-ACETAMINOPHEN 5-325 MG PO TABS: 2.0000 | ORAL_TABLET | ORAL | Status: DC | PRN

## 2017-01-31 MED ORDER — EPHEDRINE 5 MG/ML INJ: 10.0000 mg | INTRAVENOUS | Status: DC | PRN

## 2017-01-31 MED ORDER — FENTANYL 2.5 MCG/ML BUPIVACAINE 1/10 % EPIDURAL INFUSION (WH - ANES): 14.0000 mL/h | INTRAMUSCULAR | Status: DC | PRN

## 2017-01-31 MED ORDER — LACTATED RINGERS IV SOLN: 500.0000 mL | INTRAVENOUS | Status: DC | PRN

## 2017-01-31 MED ORDER — ONDANSETRON HCL 4 MG/2ML IJ SOLN: 4.0000 mg | Freq: Four times a day (QID) | INTRAMUSCULAR | Status: DC | PRN

## 2017-01-31 MED ORDER — EPHEDRINE 5 MG/ML INJ
10.0000 mg | INTRAVENOUS | Status: DC | PRN
  Filled 2017-01-31: qty 2

## 2017-01-31 MED ORDER — OXYCODONE-ACETAMINOPHEN 5-325 MG PO TABS: 1.0000 | ORAL_TABLET | ORAL | Status: DC | PRN

## 2017-01-31 MED ORDER — OXYTOCIN 40 UNITS IN LACTATED RINGERS INFUSION - SIMPLE MED
INTRAVENOUS | Status: AC
  Filled 2017-01-31: qty 1000

## 2017-01-31 MED ORDER — OXYTOCIN 40 UNITS IN LACTATED RINGERS INFUSION - SIMPLE MED: 2.5000 [IU]/h | INTRAVENOUS | Status: DC

## 2017-01-31 MED ORDER — PRENATAL MULTIVITAMIN CH
1.0000 | ORAL_TABLET | Freq: Every day | ORAL | Status: DC
  Administered 2017-02-01: 1 via ORAL
  Filled 2017-01-31: qty 1

## 2017-01-31 MED ORDER — FENTANYL CITRATE (PF) 100 MCG/2ML IJ SOLN
100.0000 ug | INTRAMUSCULAR | Status: DC | PRN
  Administered 2017-01-31 (×2): 100 ug via INTRAVENOUS
  Filled 2017-01-31 (×2): qty 2

## 2017-01-31 MED ORDER — PHENYLEPHRINE 40 MCG/ML (10ML) SYRINGE FOR IV PUSH (FOR BLOOD PRESSURE SUPPORT)
80.0000 ug | PREFILLED_SYRINGE | INTRAVENOUS | Status: DC | PRN
  Filled 2017-01-31: qty 5

## 2017-01-31 MED ORDER — ZOLPIDEM TARTRATE 5 MG PO TABS: 5.0000 mg | ORAL_TABLET | Freq: Every evening | ORAL | Status: DC | PRN

## 2017-01-31 MED ORDER — OXYTOCIN BOLUS FROM INFUSION
500.0000 mL | Freq: Once | INTRAVENOUS | Status: AC
  Administered 2017-01-31: 500 mL via INTRAVENOUS

## 2017-01-31 MED ORDER — DIBUCAINE 1 % RE OINT: 1.0000 "application " | TOPICAL_OINTMENT | RECTAL | Status: DC | PRN

## 2017-01-31 MED ORDER — BENZOCAINE-MENTHOL 20-0.5 % EX AERO
1.0000 "application " | INHALATION_SPRAY | CUTANEOUS | Status: DC | PRN
  Administered 2017-01-31: 1 via TOPICAL
  Filled 2017-01-31: qty 56

## 2017-01-31 MED ORDER — DIPHENHYDRAMINE HCL 25 MG PO CAPS: 25.0000 mg | ORAL_CAPSULE | Freq: Four times a day (QID) | ORAL | Status: DC | PRN

## 2017-01-31 MED ORDER — DIPHENHYDRAMINE HCL 50 MG/ML IJ SOLN: 12.5000 mg | INTRAMUSCULAR | Status: DC | PRN

## 2017-01-31 MED ORDER — ACETAMINOPHEN 325 MG PO TABS: 650.0000 mg | ORAL_TABLET | ORAL | Status: DC | PRN

## 2017-01-31 MED ORDER — PHENYLEPHRINE 40 MCG/ML (10ML) SYRINGE FOR IV PUSH (FOR BLOOD PRESSURE SUPPORT): 80.0000 ug | PREFILLED_SYRINGE | INTRAVENOUS | Status: DC | PRN

## 2017-01-31 MED ORDER — IBUPROFEN 600 MG PO TABS
600.0000 mg | ORAL_TABLET | Freq: Four times a day (QID) | ORAL | Status: DC
  Administered 2017-01-31 – 2017-02-01 (×5): 600 mg via ORAL
  Filled 2017-01-31 (×5): qty 1

## 2017-01-31 MED ORDER — FLEET ENEMA 7-19 GM/118ML RE ENEM: 1.0000 | ENEMA | RECTAL | Status: DC | PRN

## 2017-01-31 MED ORDER — LACTATED RINGERS IV SOLN: 500.0000 mL | Freq: Once | INTRAVENOUS | Status: DC

## 2017-01-31 MED ORDER — WITCH HAZEL-GLYCERIN EX PADS: 1.0000 "application " | MEDICATED_PAD | CUTANEOUS | Status: DC | PRN

## 2017-01-31 MED ORDER — SENNOSIDES-DOCUSATE SODIUM 8.6-50 MG PO TABS
2.0000 | ORAL_TABLET | ORAL | Status: DC
  Administered 2017-01-31: 2 via ORAL
  Filled 2017-01-31: qty 2

## 2017-01-31 MED ORDER — TETANUS-DIPHTH-ACELL PERTUSSIS 5-2.5-18.5 LF-MCG/0.5 IM SUSP: 0.5000 mL | Freq: Once | INTRAMUSCULAR | Status: DC

## 2017-01-31 MED ORDER — LIDOCAINE HCL (PF) 1 % IJ SOLN
30.0000 mL | INTRAMUSCULAR | Status: DC | PRN
  Administered 2017-01-31: 30 mL via SUBCUTANEOUS
  Filled 2017-01-31: qty 30

## 2017-01-31 MED ORDER — LACTATED RINGERS IV SOLN
INTRAVENOUS | Status: DC
  Administered 2017-01-31: 09:00:00 via INTRAVENOUS

## 2017-01-31 MED ORDER — COCONUT OIL OIL: 1.0000 "application " | TOPICAL_OIL | Status: DC | PRN

## 2017-01-31 MED ORDER — ONDANSETRON HCL 4 MG PO TABS: 4.0000 mg | ORAL_TABLET | ORAL | Status: DC | PRN

## 2017-01-31 MED ORDER — ONDANSETRON HCL 4 MG/2ML IJ SOLN: 4.0000 mg | INTRAMUSCULAR | Status: DC | PRN

## 2017-01-31 MED ORDER — SIMETHICONE 80 MG PO CHEW: 80.0000 mg | CHEWABLE_TABLET | ORAL | Status: DC | PRN

## 2017-01-31 NOTE — H&P (Signed)
Obstetric History and Physical  Gara M Begeman is a 26 y.o. G1P0000 with IUP at [redacted]w[redacted]d presenting for SOL. Patient states she has been having  regular contractions every 3-4 minutes, none vaginal bleeding, intact, ruptured membranes in MAU, with active fetal movement.    Prenatal Course Source of Care: FT  with onset of care at 9 weeks Dating: By LMP/US 9wk --->  Estimated Date of Delivery: 0/6/30 Pregnancy complications or risks: Low-risk pregnancy Patient Active Problem List   Diagnosis Date Noted  . Supervision of normal first pregnancy 07/07/2016  . UTI (urinary tract infection) 02/27/2013   She plans to breastfeed She desires oral progesterone-only contraceptive for postpartum contraception.   Sono:    @36w , CWD, normal anatomy, cephalic presentation,  16% EFW, posterior placenta  Prenatal labs and studies: ABO, Rh: B/Positive/-- (01/03 1521) Antibody: Negative (05/18 0915) Rubella: 3.46 (01/03 1521) RPR: Non Reactive (05/18 0915)  HBsAg: Negative (01/03 1521)  HIV:   Non-reactive WFU:XNATFTDD (07/16 0000) 2 hr Glucola  normal Genetic screening normal Anatomy US normal  Prenatal Transfer Tool  Maternal Diabetes: No Genetic Screening: Normal Maternal Ultrasounds/Referrals: Normal Fetal Ultrasounds or other Referrals:  None Maternal Substance Abuse:  No Significant Maternal Medications:  None Significant Maternal Lab Results: None  Past Medical History:  Diagnosis Date  . Contraceptive management 04/02/2013  . Patient desires pregnancy 01/09/2016  . UTI (urinary tract infection) 02/27/2013    Past Surgical History:  Procedure Laterality Date  . URETHRAL DILATION    . WISDOM TOOTH EXTRACTION      OB History  Gravida Para Term Preterm AB Living  1 0 0 0 0 0  SAB TAB Ectopic Multiple Live Births  0 0 0 0      # Outcome Date GA Lbr Len/2nd Weight Sex Delivery Anes PTL Lv  1 Current               Social History   Social History  . Marital status: Married    Spouse name: N/A  . Number of children: N/A  . Years of education: N/A   Social History Main Topics  . Smoking status: Never Smoker  . Smokeless tobacco: Never Used  . Alcohol use No     Comment: once a week; not now  . Drug use: No  . Sexual activity: Yes    Birth control/ protection: None   Other Topics Concern  . None   Social History Narrative  . None    Family History  Problem Relation Age of Onset  . Cancer Other        uterine   . Alcohol abuse Father   . Depression Father   . Hypertension Father   . Hypertension Maternal Grandmother   . Hypertension Maternal Grandfather   . Heart disease Maternal Grandfather   . COPD Maternal Grandfather     Prescriptions Prior to Admission  Medication Sig Dispense Refill Last Dose  . omeprazole (PRILOSEC) 20 MG capsule Take 1 capsule (20 mg total) by mouth daily. 30 capsule 3 Taking  . Prenatal Vit-Fe Fumarate-FA (PRENATAL VITAMIN PO) Take by mouth daily.    Taking    No Known Allergies  Review of Systems: Negative except for what is mentioned in HPI.  Physical Exam: BP 135/83 (BP Location: Right Arm)   Pulse 88   Temp 98.1 F (36.7 C) (Oral)   Resp 17   Ht 5\' 7"  (1.702 m)   Wt 172 lb (78 kg)   LMP 05/05/2016 (  Exact Date)   SpO2 100%   BMI 26.94 kg/m  CONSTITUTIONAL: Well-developed, well-nourished female in no acute distress.  HENT:  Normocephalic, atraumatic, External right and left ear normal. Oropharynx is clear and moist EYES: Conjunctivae and EOM are normal. Pupils are equal, round, and reactive to light. No scleral icterus.  NECK: Normal range of motion, supple, no masses SKIN: Skin is warm and dry. No rash noted. Not diaphoretic. No erythema. No pallor. NEUROLOGIC: Alert and oriented to person, place, and time. Normal reflexes, muscle tone coordination. No cranial nerve deficit noted. PSYCHIATRIC: Normal mood and affect. Normal behavior. Normal judgment and thought content. CARDIOVASCULAR: Normal heart  rate noted, regular rhythm RESPIRATORY: Effort and breath sounds normal, no problems with respiration noted ABDOMEN: Soft, nontender, nondistended, gravid. MUSCULOSKELETAL: Normal range of motion. No edema and no tenderness. 2+ distal pulses.  Dilation: 10 Dilation Complete Date: 01/31/17 Dilation Complete Time: 1021 Effacement (%): 100 Cervical Position: Middle Station: +1 Presentation: Vertex Exam by:: Karsyn Rochin  Presentation: cephalic FHT:  Baseline rate 130 bpm   Variability moderate  Accelerations present   Decelerations early Contractions: Every 3-4 mins   Pertinent Labs/Studies:   Results for orders placed or performed during the hospital encounter of 01/31/17 (from the past 24 hour(s))  Fern Test     Status: None   Collection Time: 01/31/17  8:35 AM  Result Value Ref Range   POCT Fern Test Positive = ruptured amniotic membanes   CBC     Status: Abnormal   Collection Time: 01/31/17  8:50 AM  Result Value Ref Range   WBC 16.7 (H) 4.0 - 10.5 K/uL   RBC 3.79 (L) 3.87 - 5.11 MIL/uL   Hemoglobin 11.5 (L) 12.0 - 15.0 g/dL   HCT 33.8 (L) 36.0 - 46.0 %   MCV 89.2 78.0 - 100.0 fL   MCH 30.3 26.0 - 34.0 pg   MCHC 34.0 30.0 - 36.0 g/dL   RDW 13.8 11.5 - 15.5 %   Platelets 348 150 - 400 K/uL  Type and screen [place]     Status: None   Collection Time: 01/31/17  8:50 AM  Result Value Ref Range   ABO/RH(D) B POS    Antibody Screen NEG    Sample Expiration 02/03/2017     Assessment : Parrie M Osten is a 26 y.o. G1P0000 at [redacted]w[redacted]d being admitted for active labor.  Plan: Labor: Expectant management. Analgesia as needed. FWB: Reassuring fetal heart tracing.  GBS negative Delivery plan: Hopeful for vaginal delivery  Luiz Blare, DO [facility] [place] 01/31/2017, 8:46 AM

## 2017-01-31 NOTE — MAU Note (Signed)
Pt reports regular contractions since 2am. Pt states they are every 3-4 mins. Pt denies vag bleeding or LOF. Reports good fetal movement. States she was 3-4cm on last SVE.

## 2017-02-01 LAB — BIRTH TISSUE RECOVERY COLLECTION (PLACENTA DONATION)

## 2017-02-01 MED ORDER — IBUPROFEN 600 MG PO TABS: 600.0000 mg | ORAL_TABLET | Freq: Four times a day (QID) | ORAL | 0 refills | Status: DC

## 2017-02-01 MED ORDER — MEDROXYPROGESTERONE ACETATE 150 MG/ML IM SUSP
150.0000 mg | Freq: Once | INTRAMUSCULAR | Status: AC
  Administered 2017-02-01: 150 mg via INTRAMUSCULAR
  Filled 2017-02-01: qty 1

## 2017-02-01 NOTE — Discharge Summary (Signed)
       OB Discharge Summary  Patient Name: Sara Kirby DOB: 1991/03/17 MRN: [id number]  Date of admission: 01/31/2017 Delivering MD: Luiz Blare Y   Date of discharge: 02/01/2017  Admitting diagnosis: 38 WEEKS CTX  Intrauterine pregnancy: [redacted]w[redacted]d     Secondary diagnosis:Active Problems:   Normal labor      Discharge diagnosis: Term Pregnancy Delivered                                                                      Complications: None  Hospital course:  Onset of Labor With Vaginal Delivery     26 y.o. yo G1P1001 at 107w5d was admitted in Active Labor on 01/31/2017. Patient had an uncomplicated labor course as follows:  Membrane Rupture Time/Date: 8:10 AM ,01/31/2017   Intrapartum Procedures: Episiotomy: None [1]                                         Lacerations:  Sulcus [9]  Patient had a delivery of a Viable infant. 01/31/2017  Information for the patient's newborn:  Jael, [place]ay[place] [[id number]]  Delivery Method: Vaginal, Vacuum (Extractor) (Filed from Delivery Summary)    Pateint had an uncomplicated postpartum course.  She is ambulating, tolerating a regular diet, passing flatus, and urinating well. Patient is discharged home in stable condition on 02/01/17.   Physical exam  Vitals:   01/31/17 1241 01/31/17 1351 01/31/17 1709 02/01/17 0605  BP: 134/73 136/78 131/77 112/64  Pulse: 82 77 81 67  Resp: 20 20 18 18   Temp: 97.7 F (36.5 C) 98.1 F (36.7 C) 98 F (36.7 C) 98 F (36.7 C)  TempSrc: Oral Oral Oral Oral  SpO2:      Weight:      Height:       General: alert Lochia: appropriate Uterine Fundus: firm and NT at U-1 Incision: N/A DVT Evaluation: No evidence of DVT seen on physical exam. Labs: Lab Results  Component Value Date   WBC 16.7 (H) 01/31/2017   HGB 11.5 (L) 01/31/2017   HCT 33.8 (L) 01/31/2017   MCV 89.2 01/31/2017   PLT 348 01/31/2017   No flowsheet data found.  Discharge instruction: per After Visit Summary and "Baby and Me  Booklet".  After Visit Meds:  Allergies as of 02/01/2017   No Known Allergies     Medication List    TAKE these medications   ibuprofen 600 MG tablet Commonly known as:  ADVIL,MOTRIN Take 1 tablet (600 mg total) by mouth every 6 (six) hours.   omeprazole 20 MG capsule Commonly known as:  PRILOSEC Take 1 capsule (20 mg total) by mouth daily.   PRENATAL VITAMIN PO Take by mouth daily.       Diet: routine diet  Activity: Advance as tolerated. Pelvic rest for 6 weeks.   Outpatient follow up:4 weeks Follow up Appt:No future appointments. Follow up visit: No Follow-up on file.  Postpartum contraception: Depo Provera  Newborn Data: Live born female  Birth Weight: 8 lb (3629 g) APGAR: 8, 9  Baby Feeding: Breast Disposition:home with mother   02/01/2017 Emily Filbert, MD

## 2017-02-01 NOTE — Discharge Instructions (Signed)
Breastfeeding °Deciding to breastfeed is one of the best choices you can make for you and your baby. A change in hormones during pregnancy causes your breast tissue to grow and increases the number and size of your milk ducts. These hormones also allow proteins, sugars, and fats from your blood supply to make breast milk in your milk-producing glands. Hormones prevent breast milk from being released before your baby is born as well as prompt milk flow after birth. Once breastfeeding has begun, thoughts of your baby, as well as his or her sucking or crying, can stimulate the release of milk from your milk-producing glands. °Benefits of breastfeeding °For Your Baby °· Your first milk (colostrum) helps your baby's digestive system function better. °· There are antibodies in your milk that help your baby fight off infections. °· Your baby has a lower incidence of asthma, allergies, and sudden infant death syndrome. °· The nutrients in breast milk are better for your baby than infant formulas and are designed uniquely for your baby’s needs. °· Breast milk improves your baby's brain development. °· Your baby is less likely to develop other conditions, such as childhood obesity, asthma, or type 2 diabetes mellitus. ° °For You °· Breastfeeding helps to create a very special bond between you and your baby. °· Breastfeeding is convenient. Breast milk is always available at the correct temperature and costs nothing. °· Breastfeeding helps to burn calories and helps you lose the weight gained during pregnancy. °· Breastfeeding makes your uterus contract to its prepregnancy size faster and slows bleeding (lochia) after you give birth. °· Breastfeeding helps to lower your risk of developing type 2 diabetes mellitus, osteoporosis, and breast or ovarian cancer later in life. ° °Signs that your baby is hungry °Early Signs of Hunger °· Increased alertness or activity. °· Stretching. °· Movement of the head from side to  side. °· Movement of the head and opening of the mouth when the corner of the mouth or cheek is stroked (rooting). °· Increased sucking sounds, smacking lips, cooing, sighing, or squeaking. °· Hand-to-mouth movements. °· Increased sucking of fingers or hands. ° °Late Signs of Hunger °· Fussing. °· Intermittent crying. ° °Extreme Signs of Hunger °Signs of extreme hunger will require calming and consoling before your baby will be able to breastfeed successfully. Do not wait for the following signs of extreme hunger to occur before you initiate breastfeeding: °· Restlessness. °· A loud, strong cry. °· Screaming. ° °Breastfeeding basics °Breastfeeding Initiation °· Find a comfortable place to sit or lie down, with your neck and back well supported. °· Place a pillow or rolled up blanket under your baby to bring him or her to the level of your breast (if you are seated). Nursing pillows are specially designed to help support your arms and your baby while you breastfeed. °· Make sure that your baby's abdomen is facing your abdomen. °· Gently massage your breast. With your fingertips, massage from your chest wall toward your nipple in a circular motion. This encourages milk flow. You may need to continue this action during the feeding if your milk flows slowly. °· Support your breast with 4 fingers underneath and your thumb above your nipple. Make sure your fingers are well away from your nipple and your baby’s mouth. °· Stroke your baby's lips gently with your finger or nipple. °· When your baby's mouth is open wide enough, quickly bring your baby to your breast, placing your entire nipple and as much of the colored area   around your nipple (areola) as possible into your baby's mouth. °? More areola should be visible above your baby's upper lip than below the lower lip. °? Your baby's tongue should be between his or her lower gum and your breast. °· Ensure that your baby's mouth is correctly positioned around your nipple  (latched). Your baby's lips should create a seal on your breast and be turned out (everted). °· It is common for your baby to suck about 2-3 minutes in order to start the flow of breast milk. ° °Latching °Teaching your baby how to latch on to your breast properly is very important. An improper latch can cause nipple pain and decreased milk supply for you and poor weight gain in your baby. Also, if your baby is not latched onto your nipple properly, he or she may swallow some air during feeding. This can make your baby fussy. Burping your baby when you switch breasts during the feeding can help to get rid of the air. However, teaching your baby to latch on properly is still the best way to prevent fussiness from swallowing air while breastfeeding. °Signs that your baby has successfully latched on to your nipple: °· Silent tugging or silent sucking, without causing you pain. °· Swallowing heard between every 3-4 sucks. °· Muscle movement above and in front of his or her ears while sucking. ° °Signs that your baby has not successfully latched on to nipple: °· Sucking sounds or smacking sounds from your baby while breastfeeding. °· Nipple pain. ° °If you think your baby has not latched on correctly, slip your finger into the corner of your baby’s mouth to break the suction and place it between your baby's gums. Attempt breastfeeding initiation again. °Signs of Successful Breastfeeding °Signs from your baby: °· A gradual decrease in the number of sucks or complete cessation of sucking. °· Falling asleep. °· Relaxation of his or her body. °· Retention of a small amount of milk in his or her mouth. °· Letting go of your breast by himself or herself. ° °Signs from you: °· Breasts that have increased in firmness, weight, and size 1-3 hours after feeding. °· Breasts that are softer immediately after breastfeeding. °· Increased milk volume, as well as a change in milk consistency and color by the fifth day of  breastfeeding. °· Nipples that are not sore, cracked, or bleeding. ° °Signs That Your Baby is Getting Enough Milk °· Wetting at least 1-2 diapers during the first 24 hours after birth. °· Wetting at least 5-6 diapers every 24 hours for the first week after birth. The urine should be clear or pale yellow by 5 days after birth. °· Wetting 6-8 diapers every 24 hours as your baby continues to grow and develop. °· At least 3 stools in a 24-hour period by age 5 days. The stool should be soft and yellow. °· At least 3 stools in a 24-hour period by age 7 days. The stool should be seedy and yellow. °· No loss of weight greater than 10% of birth weight during the first 3 days of age. °· Average weight gain of 4-7 ounces (113-198 g) per week after age 4 days. °· Consistent daily weight gain by age 5 days, without weight loss after the age of 2 weeks. ° °After a feeding, your baby may spit up a small amount. This is common. °Breastfeeding frequency and duration °Frequent feeding will help you make more milk and can prevent sore nipples and breast engorgement. Breastfeed when   you feel the need to reduce the fullness of your breasts or when your baby shows signs of hunger. This is called "breastfeeding on demand." Avoid introducing a pacifier to your baby while you are working to establish breastfeeding (the first 4-6 weeks after your baby is born). After this time you may choose to use a pacifier. Research has shown that pacifier use during the first year of a baby's life decreases the risk of sudden infant death syndrome (SIDS). °Allow your baby to feed on each breast as long as he or she wants. Breastfeed until your baby is finished feeding. When your baby unlatches or falls asleep while feeding from the first breast, offer the second breast. Because newborns are often sleepy in the first few weeks of life, you may need to awaken your baby to get him or her to feed. °Breastfeeding times will vary from baby to baby. However,  the following rules can serve as a guide to help you ensure that your baby is properly fed: °· Newborns (babies 4 weeks of age or younger) may breastfeed every 1-3 hours. °· Newborns should not go longer than 3 hours during the day or 5 hours during the night without breastfeeding. °· You should breastfeed your baby a minimum of 8 times in a 24-hour period until you begin to introduce solid foods to your baby at around 6 months of age. ° °Breast milk pumping °Pumping and storing breast milk allows you to ensure that your baby is exclusively fed your breast milk, even at times when you are unable to breastfeed. This is especially important if you are going back to work while you are still breastfeeding or when you are not able to be present during feedings. Your lactation consultant can give you guidelines on how long it is safe to store breast milk. °A breast pump is a machine that allows you to pump milk from your breast into a sterile bottle. The pumped breast milk can then be stored in a refrigerator or freezer. Some breast pumps are operated by hand, while others use electricity. Ask your lactation consultant which type will work best for you. Breast pumps can be purchased, but some hospitals and breastfeeding support groups lease breast pumps on a monthly basis. A lactation consultant can teach you how to hand express breast milk, if you prefer not to use a pump. °Caring for your breasts while you breastfeed °Nipples can become dry, cracked, and sore while breastfeeding. The following recommendations can help keep your breasts moisturized and healthy: °· Avoid using soap on your nipples. °· Wear a supportive bra. Although not required, special nursing bras and tank tops are designed to allow access to your breasts for breastfeeding without taking off your entire bra or top. Avoid wearing underwire-style bras or extremely tight bras. °· Air dry your nipples for 3-4 minutes after each feeding. °· Use only cotton  bra pads to absorb leaked breast milk. Leaking of breast milk between feedings is normal. °· Use lanolin on your nipples after breastfeeding. Lanolin helps to maintain your skin's normal moisture barrier. If you use pure lanolin, you do not need to wash it off before feeding your baby again. Pure lanolin is not toxic to your baby. You may also hand express a few drops of breast milk and gently massage that milk into your nipples and allow the milk to air dry. ° °In the first few weeks after giving birth, some women experience extremely full breasts (engorgement). Engorgement can make your   breasts feel heavy, warm, and tender to the touch. Engorgement peaks within 3-5 days after you give birth. The following recommendations can help ease engorgement: °· Completely empty your breasts while breastfeeding or pumping. You may want to start by applying warm, moist heat (in the shower or with warm water-soaked hand towels) just before feeding or pumping. This increases circulation and helps the milk flow. If your baby does not completely empty your breasts while breastfeeding, pump any extra milk after he or she is finished. °· Wear a snug bra (nursing or regular) or tank top for 1-2 days to signal your body to slightly decrease milk production. °· Apply ice packs to your breasts, unless this is too uncomfortable for you. °· Make sure that your baby is latched on and positioned properly while breastfeeding. ° °If engorgement persists after 48 hours of following these recommendations, contact your health care provider or a lactation consultant. °Overall health care recommendations while breastfeeding °· Eat healthy foods. Alternate between meals and snacks, eating 3 of each per day. Because what you eat affects your breast milk, some of the foods may make your baby more irritable than usual. Avoid eating these foods if you are sure that they are negatively affecting your baby. °· Drink milk, fruit juice, and water to  satisfy your thirst (about 10 glasses a day). °· Rest often, relax, and continue to take your prenatal vitamins to prevent fatigue, stress, and anemia. °· Continue breast self-awareness checks. °· Avoid chewing and smoking tobacco. Chemicals from cigarettes that pass into breast milk and exposure to secondhand smoke may harm your baby. °· Avoid alcohol and drug use, including marijuana. °Some medicines that may be harmful to your baby can pass through breast milk. It is important to ask your health care provider before taking any medicine, including all over-the-counter and prescription medicine as well as vitamin and herbal supplements. °It is possible to become pregnant while breastfeeding. If birth control is desired, ask your health care provider about options that will be safe for your baby. °Contact a health care provider if: °· You feel like you want to stop breastfeeding or have become frustrated with breastfeeding. °· You have painful breasts or nipples. °· Your nipples are cracked or bleeding. °· Your breasts are red, tender, or warm. °· You have a swollen area on either breast. °· You have a fever or chills. °· You have nausea or vomiting. °· You have drainage other than breast milk from your nipples. °· Your breasts do not become full before feedings by the fifth day after you give birth. °· You feel sad and depressed. °· Your baby is too sleepy to eat well. °· Your baby is having trouble sleeping. °· Your baby is wetting less than 3 diapers in a 24-hour period. °· Your baby has less than 3 stools in a 24-hour period. °· Your baby's skin or the [place] part of his or her eyes becomes yellow. °· Your baby is not gaining weight by 5 days of age. °Get help right away if: °· Your baby is overly tired (lethargic) and does not want to wake up and feed. °· Your baby develops an unexplained fever. °This information is not intended to replace advice given to you by your health care provider. Make sure you discuss  any questions you have with your health care provider. °Document Released: 06/21/2005 Document Revised: 12/03/2015 Document Reviewed: 12/13/2012 °Elsevier Interactive Patient Education © 2017 [employer] °Home Care Instructions for Mom °ACTIVITY °·   Gradually return to your regular activities. °· Let yourself rest. Nap while your baby sleeps. °· Avoid lifting anything that is heavier than 10 lb (4.5 kg) until your health care provider says it is okay. °· Avoid activities that take a lot of effort and energy (are strenuous) until approved by your health care provider. Walking at a slow-to-moderate pace is usually safe. °· If you had a cesarean delivery: °? Do not vacuum, climb stairs, or drive a car for 4-6 weeks. °? Have someone help you at home until you feel like you can do your usual activities yourself. °? Do exercises as told by your health care provider, if this applies. ° °VAGINAL BLEEDING °You may continue to bleed for 4-6 weeks after delivery. Over time, the amount of blood usually decreases and the color of the blood usually gets lighter. However, the flow of bright red blood may increase if you have been too active. If you need to use more than one pad in an hour because your pad gets soaked, or if you pass a large clot: °· Lie down. °· Raise your feet. °· Place a cold compress on your lower abdomen. °· Rest. °· Call your health care provider. ° °If you are breastfeeding, your period should return anytime between 8 weeks after delivery and the time that you stop breastfeeding. If you are not breastfeeding, your period should return 6-8 weeks after delivery. °PERINEAL CARE °The perineal area, or perineum, is the part of your body between your thighs. After delivery, this area needs special care. Follow these instructions as told by your health care provider. °· Take warm tub baths for 15-20 minutes. °· Use medicated pads and pain-relieving sprays and creams as told. °· Do not use tampons or douches until  vaginal bleeding has stopped. °· Each time you go to the bathroom: °? Use a peri bottle. °? Change your pad. °? Use towelettes in place of toilet paper until your stitches have healed. °· Do Kegel exercises every day. Kegel exercises help to maintain the muscles that support the vagina, bladder, and bowels. You can do these exercises while you are standing, sitting, or lying down. To do Kegel exercises: °? Tighten the muscles of your abdomen and the muscles that surround your birth canal. °? Hold for a few seconds. °? Relax. °? Repeat until you have done this 5 times in a row. °· To prevent hemorrhoids from developing or getting worse: °? Drink enough fluid to keep your urine clear or pale yellow. °? Avoid straining when having a bowel movement. °? Take over-the-counter medicines and stool softeners as told by your health care provider. ° °BREAST CARE °· Wear a tight-fitting bra. °· Avoid taking over-the-counter pain medicine for breast discomfort. °· Apply ice to the breasts to help with discomfort as needed: °? Put ice in a plastic bag. °? Place a towel between your skin and the bag. °? Leave the ice on for 20 minutes or as told by your health care provider. ° °NUTRITION °· Eat a well-balanced diet. °· Do not try to lose weight quickly by cutting back on calories. °· Take your prenatal vitamins until your postpartum checkup or until your health care provider tells you to stop. ° °POSTPARTUM DEPRESSION °You may find yourself crying for no apparent reason and unable to cope with all of the changes that come with having a newborn. This mood is called postpartum depression. Postpartum depression happens because your hormone levels change after delivery. If you have   postpartum depression, get support from your partner, friends, and family. If the depression does not go away on its own after several weeks, contact your health care provider. °BREAST SELF-EXAM °Do a breast self-exam each month, at the same time of the  month. If you are breastfeeding, check your breasts just after a feeding, when your breasts are less full. If you are breastfeeding and your period has started, check your breasts on day 5, 6, or 7 of your period. °Report any lumps, bumps, or discharge to your health care provider. Know that breasts are normally lumpy if you are breastfeeding. This is temporary, and it is not a health risk. °INTIMACY AND SEXUALITY °Avoid sexual activity for at least 3-4 weeks after delivery or until the brownish-red vaginal flow is completely gone. If you want to avoid pregnancy, use some form of birth control. You can get pregnant after delivery, even if you have not had your period. °SEEK MEDICAL CARE IF: °· You feel unable to cope with the changes that a child brings to your life, and these feelings do not go away after several weeks. °· You notice a lump, a bump, or discharge on your breast. ° °SEEK IMMEDIATE MEDICAL CARE IF: °· Blood soaks your pad in 1 hour or less. °· You have: °? Severe pain or cramping in your lower abdomen. °? A bad-smelling vaginal discharge. °? A fever that is not controlled by medicine. °? A fever, and an area of your breast is red and sore. °? Pain or redness in your calf. °? Sudden, severe chest pain. °? Shortness of breath. °? Painful or bloody urination. °? Problems with your vision. °· You vomit for 12 hours or longer. °· You develop a severe headache. °· You have serious thoughts about hurting yourself, your child, or anyone else. ° °This information is not intended to replace advice given to you by your health care provider. Make sure you discuss any questions you have with your health care provider. °Document Released: 06/18/2000 Document Revised: 11/27/2015 Document Reviewed: 12/23/2014 °Elsevier Interactive Patient Education © 2017 [employer] ° °

## 2017-02-01 NOTE — Lactation Note (Signed)
This note was copied from a baby's chart. Lactation Consultation Note  Patient Name: Sara Kirby Today's Date: 02/01/2017 Reason for consult: Initial assessment Breastfeeding consultation services and support information given and reviewed.  This is mom's first baby and newborn is 9 hours old.  Mom states baby just finished a feeding and baby latching well.  Instructed to feed with any feeding cue and cluster feeding discussed.  Reviewed milk coming to volume and engorgement treatment.  Outpatient services recommended.  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  Maternal Data Has patient been taught Hand Expression?: Yes Does the patient have breastfeeding experience prior to this delivery?: No  Feeding Feeding Type: Breast Fed Length of feed: 5 min  LATCH Score                   Interventions    Lactation Tools Discussed/Used     Consult Status Consult Status: Complete    Ave Filter 02/01/2017, 9:36 AM

## 2017-02-01 NOTE — Progress Notes (Signed)
Patient ID: Sara Kirby, female   DOB: 11-13-1990, 26 y.o.   MRN: [id number]

## 2017-02-03 ENCOUNTER — Encounter: Payer: 59 | Admitting: Obstetrics & Gynecology

## 2017-03-15 ENCOUNTER — Ambulatory Visit (INDEPENDENT_AMBULATORY_CARE_PROVIDER_SITE_OTHER): Payer: 59 | Admitting: Advanced Practice Midwife

## 2017-03-15 ENCOUNTER — Encounter: Payer: Self-pay | Admitting: Advanced Practice Midwife

## 2017-03-15 DIAGNOSIS — Q525 Fusion of labia: Secondary | ICD-10-CM

## 2017-03-15 MED ORDER — MEDROXYPROGESTERONE ACETATE 150 MG/ML IM SUSP: 150.0000 mg | INTRAMUSCULAR | 3 refills | Status: DC

## 2017-03-15 NOTE — Progress Notes (Signed)
Sara Kirby is a 26 y.o. who presents for a postpartum visit. She is 6 weeks postpartum following a spontaneous vaginal delivery. I have fully reviewed the prenatal and intrapartum course. The delivery was at 38.5 gestational weeks.  Anesthesia: local. Postpartum course has been uneventful. Baby's course has been uneventful. Baby is feeding by breast. Bleeding: staining only. Bowel function is normal. Bladder function is normal. Patient is not sexually active. Contraception method is Depo-Provera injections. Postpartum depression screening: negative.   Current Outpatient Prescriptions:  .  ibuprofen (ADVIL,MOTRIN) 600 MG tablet, Take 1 tablet (600 mg total) by mouth every 6 (six) hours., Disp: 30 tablet, Rfl: 0 .  omeprazole (PRILOSEC) 20 MG capsule, Take 1 capsule (20 mg total) by mouth daily., Disp: 30 capsule, Rfl: 3 .  Prenatal Vit-Fe Fumarate-FA (PRENATAL VITAMIN PO), Take by mouth daily. , Disp: , Rfl:   Review of Systems   Constitutional: Negative for fever and chills Eyes: Negative for visual disturbances Respiratory: Negative for shortness of breath, dyspnea Cardiovascular: Negative for chest pain or palpitations  Gastrointestinal: Negative for vomiting, diarrhea and constipation Genitourinary: Negative for dysuria and urgency Musculoskeletal: Negative for back pain, joint pain, myalgias  Neurological: Negative for dizziness and headaches    Objective:    There were no vitals filed for this visit. General:  alert, cooperative and no distress   Breasts:  negative  Lungs: clear to auscultation bilaterally  Heart:  regular rate and rhythm  Abdomen: Soft, nontender   Vulva:  normal  Vagina: normal vagina  Cervix:  closed  Corpus: Well involuted     Rectal Exam: no hemorrhoids        Assessment:    normal postpartum exam.  Plan:   1. Contraception: Depo-Provera injections 2. Follow up in: 6 weeks or as needed.

## 2017-03-15 NOTE — Progress Notes (Signed)
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 It does not appear to be an area of labial lacerations which is healed together just looks like a fusion but is quite thick and will not respond to estrogen topically As a result is prepped with Betadine 3 cc of half percent Marcaine is injected Scissors were used and the adhesion is cut in the middle Interrupted figure-of-eight sutures placed for hemostasis without difficulty , Give her some ashen cream to go home with and she can start using it in a week, see her back in 2 weeks  She also instructed not to have intercourse until after this heals completely and when she does to use lubrication  Florian Buff, MD 03/15/2017 4:21 PM

## 2017-03-29 ENCOUNTER — Ambulatory Visit (INDEPENDENT_AMBULATORY_CARE_PROVIDER_SITE_OTHER): Payer: 59 | Admitting: Obstetrics & Gynecology

## 2017-03-29 ENCOUNTER — Encounter: Payer: Self-pay | Admitting: Obstetrics & Gynecology

## 2017-03-29 VITALS — BP 100/70 | HR 72 | Wt 140.0 lb

## 2017-03-29 DIAGNOSIS — Q525 Fusion of labia: Secondary | ICD-10-CM

## 2017-03-29 DIAGNOSIS — Z9889 Other specified postprocedural states: Secondary | ICD-10-CM

## 2017-03-29 NOTE — Progress Notes (Signed)
      Chief Complaint  Patient presents with  . Follow-up    Blood pressure 100/70, pulse 72, weight 140 lb (63.5 kg), unknown if currently breastfeeding.  26 y.o. G1P1001 No LMP recorded. The current method of family planning is Depo-Provera injections.breastfeeding  Outpatient Encounter Prescriptions as of 03/29/2017  Medication Sig  . medroxyPROGESTERone (DEPO-PROVERA) 150 MG/ML injection Inject 1 mL (150 mg total) into the muscle every 3 (three) months.  Marland Kitchen ibuprofen (ADVIL,MOTRIN) 600 MG tablet Take 1 tablet (600 mg total) by mouth every 6 (six) hours. (Patient not taking: Reported on 03/15/2017)  . omeprazole (PRILOSEC) 20 MG capsule Take 1 capsule (20 mg total) by mouth daily. (Patient not taking: Reported on 03/15/2017)  . Prenatal Vit-Fe Fumarate-FA (PRENATAL VITAMIN PO) Take by mouth daily.    No facility-administered encounter medications on file as of 03/29/2017.     Subjective Sara Kirby seen back for 2 weeks after an incision to correct postpartum labial fusion Patient states she has a problems so we did that in the office on 911  Objective Exam today looks completely 100% normally can even tell that we have done anything mother was ever any real fusion  Pertinent ROS   Labs or studies     Impression Diagnoses this Encounter::   ICD-10-CM   1. Labial fusion Q52.5    from postparum healing    Established relevant diagnosis(es):   Plan/Recommendations: No orders of the defined types were placed in this encounter.   Labs or Scans Ordered: No orders of the defined types were placed in this encounter.   Management:: No further follow-up is needed for this The patient may come off with pelvic rest Her uterus is normally involuted Follow up for yearly or as needed Continue every 12 week medroxyprogesterone injections  Follow up Return in about 3 months (around 06/27/2017) for depo shot.     All questions were answered.  Past Medical History:    Diagnosis Date  . Contraceptive management 04/02/2013  . Patient desires pregnancy 01/09/2016  . UTI (urinary tract infection) 02/27/2013    Past Surgical History:  Procedure Laterality Date  . URETHRAL DILATION    . WISDOM TOOTH EXTRACTION      OB History    Gravida Para Term Preterm AB Living   1 1 1  0 0 1   SAB TAB Ectopic Multiple Live Births   0 0 0 0 1      No Known Allergies  Social History   Social History  . Marital status: Married    Spouse name: N/A  . Number of children: N/A  . Years of education: N/A   Social History Main Topics  . Smoking status: Never Smoker  . Smokeless tobacco: Never Used  . Alcohol use No     Comment: once a week; not now  . Drug use: No  . Sexual activity: Not Currently    Birth control/ protection: None   Other Topics Concern  . None   Social History Narrative  . None    Family History  Problem Relation Age of Onset  . Cancer Other        uterine   . Alcohol abuse Father   . Depression Father   . Hypertension Father   . Hypertension Maternal Grandmother   . Hypertension Maternal Grandfather   . Heart disease Maternal Grandfather   . COPD Maternal Grandfather

## 2017-04-25 ENCOUNTER — Ambulatory Visit (INDEPENDENT_AMBULATORY_CARE_PROVIDER_SITE_OTHER): Payer: 59

## 2017-04-25 VITALS — BP 120/90 | Wt 136.0 lb

## 2017-04-25 DIAGNOSIS — Z3042 Encounter for surveillance of injectable contraceptive: Secondary | ICD-10-CM | POA: Diagnosis not present

## 2017-04-25 DIAGNOSIS — Z3202 Encounter for pregnancy test, result negative: Secondary | ICD-10-CM

## 2017-04-25 LAB — POCT URINE PREGNANCY: Preg Test, Ur: NEGATIVE

## 2017-04-25 MED ORDER — MEDROXYPROGESTERONE ACETATE 150 MG/ML IM SUSP
150.0000 mg | Freq: Once | INTRAMUSCULAR | Status: AC
  Administered 2017-04-25: 150 mg via INTRAMUSCULAR

## 2017-04-25 NOTE — Progress Notes (Signed)
PT here for depo shot 150 mg IM given lt deltoid. Tolerated well.return 12 week for next shot. [place]

## 2017-07-18 ENCOUNTER — Encounter: Payer: Self-pay | Admitting: *Deleted

## 2017-07-18 ENCOUNTER — Ambulatory Visit (INDEPENDENT_AMBULATORY_CARE_PROVIDER_SITE_OTHER): Payer: No Typology Code available for payment source | Admitting: *Deleted

## 2017-07-18 DIAGNOSIS — Z308 Encounter for other contraceptive management: Secondary | ICD-10-CM

## 2017-07-18 DIAGNOSIS — Z3202 Encounter for pregnancy test, result negative: Secondary | ICD-10-CM | POA: Diagnosis not present

## 2017-07-18 LAB — POCT URINE PREGNANCY: Preg Test, Ur: NEGATIVE

## 2017-07-18 MED ORDER — MEDROXYPROGESTERONE ACETATE 150 MG/ML IM SUSP
150.0000 mg | Freq: Once | INTRAMUSCULAR | Status: AC
  Administered 2017-07-18: 150 mg via INTRAMUSCULAR

## 2017-07-18 NOTE — Progress Notes (Signed)
Pt here for Depo. Pt tolerated shot well. Return in 12 weeks for next shot. JSY 

## 2017-10-10 ENCOUNTER — Ambulatory Visit (INDEPENDENT_AMBULATORY_CARE_PROVIDER_SITE_OTHER): Payer: No Typology Code available for payment source | Admitting: *Deleted

## 2017-10-10 ENCOUNTER — Encounter: Payer: Self-pay | Admitting: *Deleted

## 2017-10-10 DIAGNOSIS — Z3042 Encounter for surveillance of injectable contraceptive: Secondary | ICD-10-CM | POA: Diagnosis not present

## 2017-10-10 DIAGNOSIS — Z3202 Encounter for pregnancy test, result negative: Secondary | ICD-10-CM | POA: Diagnosis not present

## 2017-10-10 DIAGNOSIS — Z308 Encounter for other contraceptive management: Secondary | ICD-10-CM

## 2017-10-10 LAB — POCT URINE PREGNANCY: PREG TEST UR: NEGATIVE

## 2017-10-10 MED ORDER — MEDROXYPROGESTERONE ACETATE 150 MG/ML IM SUSP
150.0000 mg | Freq: Once | INTRAMUSCULAR | Status: AC
  Administered 2017-10-10: 150 mg via INTRAMUSCULAR

## 2017-10-10 NOTE — Progress Notes (Signed)
Pt here for Depo. Pt tolerated shot well. Return in 12 weeks for next shot. JSY 

## 2018-01-02 ENCOUNTER — Other Ambulatory Visit: Payer: Self-pay

## 2018-01-02 ENCOUNTER — Encounter: Payer: Self-pay | Admitting: *Deleted

## 2018-01-02 ENCOUNTER — Ambulatory Visit (INDEPENDENT_AMBULATORY_CARE_PROVIDER_SITE_OTHER): Payer: No Typology Code available for payment source | Admitting: *Deleted

## 2018-01-02 DIAGNOSIS — Z3042 Encounter for surveillance of injectable contraceptive: Secondary | ICD-10-CM

## 2018-01-02 DIAGNOSIS — Z3202 Encounter for pregnancy test, result negative: Secondary | ICD-10-CM | POA: Diagnosis not present

## 2018-01-02 LAB — POCT URINE PREGNANCY: PREG TEST UR: NEGATIVE

## 2018-01-02 MED ORDER — MEDROXYPROGESTERONE ACETATE 150 MG/ML IM SUSP
150.0000 mg | Freq: Once | INTRAMUSCULAR | Status: AC
  Administered 2018-01-02: 150 mg via INTRAMUSCULAR

## 2018-01-02 NOTE — Progress Notes (Signed)
Pt given DepoProvera 150mg IM right deltoid without complications. Advised to return in 12 weeks for next injection. 

## 2018-01-09 ENCOUNTER — Encounter: Payer: Self-pay | Admitting: Adult Health

## 2018-01-09 ENCOUNTER — Other Ambulatory Visit (HOSPITAL_COMMUNITY)
Admission: RE | Admit: 2018-01-09 | Discharge: 2018-01-09 | Disposition: A | Discharge status: Home / Self Care | Payer: No Typology Code available for payment source | Source: Ambulatory Visit | Attending: Obstetrics & Gynecology | Admitting: Obstetrics & Gynecology

## 2018-01-09 ENCOUNTER — Ambulatory Visit (INDEPENDENT_AMBULATORY_CARE_PROVIDER_SITE_OTHER): Payer: No Typology Code available for payment source | Admitting: Adult Health

## 2018-01-09 VITALS — BP 107/65 | HR 84 | Ht 68.0 in | Wt 137.4 lb

## 2018-01-09 DIAGNOSIS — Z01419 Encounter for gynecological examination (general) (routine) without abnormal findings: Secondary | ICD-10-CM | POA: Insufficient documentation

## 2018-01-09 NOTE — Addendum Note (Signed)
Addended by: Diona Fanti A on: 01/09/2018 02:56 PM   Modules accepted: Orders

## 2018-01-09 NOTE — Progress Notes (Signed)
Patient ID: Sara Kirby, female   DOB: 04/15/1991, 27 y.o.   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    Current Medications, Allergies, Past Medical History, Past Surgical History, Family History and Social History were reviewed in [employer] record.     Review of Systems:  Patient denies any headaches, hearing loss, fatigue, blurred vision, shortness of breath, chest pain, abdominal pain, problems with bowel movements, urination, or intercourse. No joint pain or mood swings. She is on depo, and is starting to wean off breast feeding and will want the pill, before next shot due the end of September.    Physical Exam:BP 107/65 (BP Location: Left Arm, Patient Position: Sitting, Cuff Size: Small)   Pulse 84   Ht 5\' 8"  (1.727 m)   Wt 137 lb 6.4 oz (62.3 kg)   Breastfeeding? Yes   BMI 20.89 kg/m  General:  Well developed, well nourished, no acute distress Skin:  Warm and dry Neck:  Midline trachea, normal thyroid, good ROM, no lymphadenopathy Lungs; Clear to auscultation bilaterally Breast:  No dominant palpable mass, retraction, or nipple discharge Cardiovascular: Regular rate and rhythm Abdomen:  Soft, non tender, no hepatosplenomegaly Pelvic:  External genitalia is normal in appearance, no lesions.  The vagina is normal in appearance. Urethra has no lesions or masses. The cervix is bulbous. Pap with reflex HPV performed.  Uterus is felt to be normal size, shape, and contour.  No adnexal masses or tenderness noted.Bladder is non tender, no masses felt. Extremities/musculoskeletal:  No swelling or varicosities noted, no clubbing or cyanosis Psych:  No mood changes, alert and cooperative,seems happy PHQ 9 score 2. Declines labs today.   Impression: 1. Encounter for gynecological  examination with Papanicolaou smear of cervix       Plan: Call when wants OCs sent to drug store Physical in 1 year Pap in 3 if normal Labs with PCP

## 2018-01-11 LAB — CYTOLOGY - PAP: Diagnosis: NEGATIVE

## 2018-03-07 ENCOUNTER — Other Ambulatory Visit: Payer: Self-pay | Admitting: Advanced Practice Midwife

## 2018-03-08 ENCOUNTER — Telehealth: Payer: Self-pay | Admitting: Adult Health

## 2018-03-08 NOTE — Telephone Encounter (Signed)
Left message I called, if wants specific pill let me know

## 2018-03-09 MED ORDER — NORGESTIM-ETH ESTRAD TRIPHASIC 0.18/0.215/0.25 MG-35 MCG PO TABS: 1.0000 | ORAL_TABLET | Freq: Every day | ORAL | 4 refills | Status: DC

## 2018-03-09 NOTE — Addendum Note (Signed)
Addended by: Derrek Monaco A on: 03/09/2018 09:24 AM   Modules accepted: Orders

## 2018-03-09 NOTE — Telephone Encounter (Signed)
rx tri previfem

## 2018-03-13 ENCOUNTER — Telehealth: Payer: Self-pay | Admitting: Adult Health

## 2018-03-13 NOTE — Telephone Encounter (Signed)
Patient called stating that she called last week to get a refill of her [facility], but pt states that she contacted her pharmacy and they state they never received it. Please contact pt

## 2018-03-13 NOTE — Telephone Encounter (Signed)
Patient states BCP was not sent to pharmacy.  Informed patient it looked like pills were sent.   Spoke to pharmacy who stated patient picked up Depo so pills were not sent.  Needed to just go to pharmacy to get pills.  Patient notified.

## 2018-03-27 ENCOUNTER — Ambulatory Visit: Payer: No Typology Code available for payment source

## 2018-05-22 ENCOUNTER — Ambulatory Visit (HOSPITAL_COMMUNITY)
Admission: EM | Admit: 2018-05-22 | Discharge: 2018-05-22 | Disposition: A | Discharge status: Home / Self Care | Payer: No Typology Code available for payment source | Attending: Family Medicine | Admitting: Family Medicine

## 2018-05-22 ENCOUNTER — Encounter (HOSPITAL_COMMUNITY): Payer: Self-pay | Admitting: Emergency Medicine

## 2018-05-22 DIAGNOSIS — Z823 Family history of stroke: Secondary | ICD-10-CM | POA: Diagnosis not present

## 2018-05-22 DIAGNOSIS — Z3202 Encounter for pregnancy test, result negative: Secondary | ICD-10-CM

## 2018-05-22 DIAGNOSIS — N3090 Cystitis, unspecified without hematuria: Secondary | ICD-10-CM | POA: Insufficient documentation

## 2018-05-22 DIAGNOSIS — Z8249 Family history of ischemic heart disease and other diseases of the circulatory system: Secondary | ICD-10-CM | POA: Diagnosis not present

## 2018-05-22 DIAGNOSIS — R300 Dysuria: Secondary | ICD-10-CM | POA: Diagnosis present

## 2018-05-22 LAB — POCT URINALYSIS DIP (DEVICE)
Bilirubin Urine: NEGATIVE
Glucose, UA: NEGATIVE mg/dL
Hgb urine dipstick: NEGATIVE
KETONES UR: NEGATIVE mg/dL
Nitrite: NEGATIVE
PH: 6.5 (ref 5.0–8.0)
PROTEIN: NEGATIVE mg/dL
Specific Gravity, Urine: 1.01 (ref 1.005–1.030)
Urobilinogen, UA: 0.2 mg/dL (ref 0.0–1.0)

## 2018-05-22 LAB — POCT PREGNANCY, URINE: PREG TEST UR: NEGATIVE

## 2018-05-22 MED ORDER — CEPHALEXIN 500 MG PO CAPS: 500.0000 mg | ORAL_CAPSULE | Freq: Two times a day (BID) | ORAL | 0 refills | Status: DC

## 2018-05-22 MED FILL — CEPHALEXIN 500 MG CAPSULE: 500 | 5 days supply | Qty: 10 | Fill #0

## 2018-05-22 NOTE — ED Provider Notes (Signed)
[facility]    CSN: [id number] Arrival date & time: 05/22/18  1313     History   Chief Complaint Chief Complaint  Patient presents with  . Urinary Tract Infection    HPI Sara Kirby is a 27 y.o. female.   27 year old female comes in for 1 to 2-week history of urinary symptoms that has been worsening the past 3 days.  Has had urinary frequency, dysuria, urinary hesitancy.  No obvious hematuria.  Denies fever, chills, night sweats.  Denies abdominal pain, nausea, vomiting.  Denies vaginal discharge, itching, spotting.  States staying hydrated can help with the symptoms mildly.     Past Medical History:  Diagnosis Date  . Contraceptive management 04/02/2013  . Patient desires pregnancy 01/09/2016  . UTI (urinary tract infection) 02/27/2013    Patient Active Problem List   Diagnosis Date Noted  . Normal labor 01/31/2017  . Supervision of normal first pregnancy 07/07/2016  . UTI (urinary tract infection) 02/27/2013    Past Surgical History:  Procedure Laterality Date  . URETHRAL DILATION    . WISDOM TOOTH EXTRACTION      OB History    Gravida  1   Para  1   Term  1   Preterm  0   AB  0   Living  1     SAB  0   TAB  0   Ectopic  0   Multiple  0   Live Births  1            Home Medications    Prior to Admission medications   Medication Sig Start Date End Date Taking? Authorizing Provider  cephALEXin (KEFLEX) 500 MG capsule Take 1 capsule (500 mg total) by mouth 2 (two) times daily. 05/22/18   Tasia Catchings, Antonya Leeder V, PA-C  medroxyPROGESTERone (DEPO-PROVERA) 150 MG/ML injection INJECT 1 ML INTO THE MUSCLE EVERY 3 MONTHS. 03/08/18   Cresenzo-Dishmon, Joaquim Lai, CNM  Norgestimate-Ethinyl Estradiol Triphasic 0.18/0.215/0.25 MG-35 MCG tablet Take 1 tablet by mouth daily. 03/09/18   Estill Dooms, NP    Family History Family History  Problem Relation Age of Onset  . Cancer Other        uterine   . Alcohol abuse Father   . Depression Father   .  Hypertension Father   . Stroke Father   . Hypertension Maternal Grandmother   . Hypertension Maternal Grandfather   . Heart disease Maternal Grandfather   . COPD Maternal Grandfather     Social History Social History   Tobacco Use  . Smoking status: Never Smoker  . Smokeless tobacco: Never Used  Substance Use Topics  . Alcohol use: No    Comment: once a week; not now  . Drug use: No     Allergies   Patient has no known allergies.   Review of Systems Review of Systems  Reason unable to perform ROS: See HPI as above.     Physical Exam Triage Vital Signs ED Triage Vitals [05/22/18 1424]  Enc Vitals Group     BP 130/69     Pulse Rate 88     Resp 18     Temp 97.9 F (36.6 C)     Temp Source Oral     SpO2 100 %     Weight      Height      Head Circumference      Peak Flow      Pain Score 4  Pain Loc      Pain Edu?      Excl. in [place]?    No data found.  Updated Vital Signs BP 130/69 (BP Location: Left Arm)   Pulse 88   Temp 97.9 F (36.6 C) (Oral)   Resp 18   SpO2 100%   Physical Exam  Constitutional: She is oriented to person, place, and time. She appears well-developed and well-nourished. No distress.  HENT:  Head: Normocephalic and atraumatic.  Eyes: Pupils are equal, round, and reactive to light. Conjunctivae are normal.  Cardiovascular: Normal rate, regular rhythm and normal heart sounds. Exam reveals no gallop and no friction rub.  No murmur heard. Pulmonary/Chest: Effort normal and breath sounds normal. She has no wheezes. She has no rales.  Abdominal: Soft. Bowel sounds are normal. She exhibits no mass. There is no tenderness. There is no rebound, no guarding and no CVA tenderness.  Neurological: She is alert and oriented to person, place, and time.  Skin: Skin is warm and dry.  Psychiatric: She has a normal mood and affect. Her behavior is normal. Judgment normal.     UC Treatments / Results  Labs (all labs ordered are listed, but only  abnormal results are displayed) Labs Reviewed  POCT URINALYSIS DIP (DEVICE) - Abnormal; Notable for the following components:      Result Value   Leukocytes, UA TRACE (*)    All other components within normal limits  URINE CULTURE  POCT PREGNANCY, URINE    EKG None  Radiology No results found.  Procedures Procedures (including critical care time)  Medications Ordered in UC Medications - No data to display  Initial Impression / Assessment and Plan / UC Course  I have reviewed the triage vital signs and the nursing notes.  Pertinent labs & imaging results that were available during my care of the patient were reviewed by me and considered in my medical decision making (see chart for details).    Urine dipstick positive for UTI. Start antibiotics as directed. Push fluids. Return precautions given.  Final Clinical Impressions(s) / UC Diagnoses   Final diagnoses:  Cystitis    ED Prescriptions    Medication Sig Dispense Auth. Provider   cephALEXin (KEFLEX) 500 MG capsule Take 1 capsule (500 mg total) by mouth 2 (two) times daily. 10 capsule Tobin Chad, [place] 05/22/18 1504

## 2018-05-22 NOTE — ED Triage Notes (Signed)
Pt here with UTI sx x 1 week worse over last three days

## 2018-05-22 NOTE — Discharge Instructions (Signed)
Your urine was positive for an urinary tract infection. Start keflex as directed. Keep hydrated, your urine should be clear to pale yellow in color. Monitor for any worsening of symptoms, fever, worsening abdominal pain, nausea/vomiting, flank pain, follow up for reevaluation.

## 2018-05-24 LAB — URINE CULTURE

## 2019-03-30 ENCOUNTER — Other Ambulatory Visit: Payer: Self-pay | Admitting: Adult Health

## 2019-07-06 NOTE — L&D Delivery Note (Signed)
OB/GYN Faculty Practice Delivery Note  Sara Kirby is a 29 y.o. G2P1001 s/p NSVD at [redacted]w[redacted]d. She was admitted for SOL.   ROM: 0h 4m with clear fluid GBS Status: Neg Maximum Maternal Temperature: 98.9  Labor Progress: . Pt labored in MAU and got to 5.5, then was admitted to L&D and rapidly progressed to 8cm with SROM and immediately had the urge to push  Delivery Date/Time: 06/05/20 at 1240 Delivery: Called to room and patient was complete and pushing. Head delivered ROA. No nuchal cord present. Shoulder and body delivered in usual fashion. Infant with spontaneous cry, placed on mother's abdomen, dried and stimulated. Cord clamped x 2 after 1-minute delay, and cut by FOB. Cord blood drawn. Placenta delivered spontaneously, intact, with 3-vessel cord. Fundus firm with massage and Pitocin. Labia, perineum, vagina, and cervix inspected, 2nd degree midline perineal laceration found (completely hemostatic and well approximated but deep) and repaired with a 3.0 vicryl figure of eight stitch.   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

## 2019-07-16 ENCOUNTER — Encounter: Payer: Self-pay | Admitting: Adult Health

## 2019-07-16 ENCOUNTER — Other Ambulatory Visit: Payer: Self-pay

## 2019-07-16 ENCOUNTER — Ambulatory Visit (INDEPENDENT_AMBULATORY_CARE_PROVIDER_SITE_OTHER): Payer: 59 | Admitting: Adult Health

## 2019-07-16 VITALS — BP 138/90 | HR 93 | Ht 67.5 in | Wt 150.0 lb

## 2019-07-16 DIAGNOSIS — K602 Anal fissure, unspecified: Secondary | ICD-10-CM | POA: Diagnosis not present

## 2019-07-16 DIAGNOSIS — N898 Other specified noninflammatory disorders of vagina: Secondary | ICD-10-CM | POA: Diagnosis not present

## 2019-07-16 NOTE — Progress Notes (Signed)
  Subjective:     Patient ID: Sara Kirby, female   DOB: 06/22/1991, 29 y.o.   MRN: [id number]  HPI Sara Kirby is a 29 year old [place] female, married, G1P1 in complaining of fishy odor the day after sex and some discomfort/pain like a tear tat times with BM and may wipe red blood.   Review of Systems Has odor the day after sex, kinda fishy Has some discomfort/pain with BM, like tear at times, and may wipe bright red like a drop smeared.  Reviewed past medical,surgical, social and family history. Reviewed medications and allergies.     Objective:   Physical Exam BP 138/90 (BP Location: Left Arm, Patient Position: Sitting, Cuff Size: Normal)   Pulse 93   Ht 5' 7.5" (1.715 m)   Wt 150 lb (68 kg)   LMP 06/26/2019 (Approximate)   Breastfeeding No   BMI 23.15 kg/m    Skin warm and dry.Pelvic: external genitalia is normal in appearance no lesions, vagina:scant Tubbs discharge without odor,urethra has no lesions or masses noted, cervix:smooth and bulbous, uterus: normal size, shape and contour, non tender, no masses felt, adnexa: no masses or tenderness noted. Bladder is non tender and no masses felt. On rectal exam has good tone, no hemorrhoids felt.  Fall risk is low Examination chaperoned by Levy Pupa LPN.  Assessment:     1. Vaginal odor Try withdrawal first and if wants to try Metrogel, call me   2. Rectal fissure Use preparation H or Anusol Can call in proctofoam HC if desired.      Plan:     Follow up prn

## 2019-10-19 ENCOUNTER — Encounter: Payer: Self-pay | Admitting: *Deleted

## 2019-10-19 ENCOUNTER — Ambulatory Visit (INDEPENDENT_AMBULATORY_CARE_PROVIDER_SITE_OTHER): Payer: 59 | Admitting: *Deleted

## 2019-10-19 ENCOUNTER — Other Ambulatory Visit: Payer: Self-pay

## 2019-10-19 VITALS — BP 134/78 | HR 93 | Ht 67.0 in | Wt 147.0 lb

## 2019-10-19 DIAGNOSIS — Z3201 Encounter for pregnancy test, result positive: Secondary | ICD-10-CM

## 2019-10-19 LAB — POCT URINE PREGNANCY: Preg Test, Ur: POSITIVE — AB

## 2019-10-19 NOTE — Progress Notes (Addendum)
   NURSE VISIT- PREGNANCY CONFIRMATION   SUBJECTIVE:  Sara Kirby is a 29 y.o. G2P1001 female at [redacted]w[redacted]d by certain LMP of Patient's last menstrual period was 09/12/2019. Here for pregnancy confirmation.  Home pregnancy test: positive x two  She reports no complaints.  She is taking prenatal vitamins.    OBJECTIVE:  BP 134/78 (BP Location: Left Arm, Patient Position: Sitting, Cuff Size: Normal)   Pulse 93   Ht 5\' 7"  (1.702 m)   Wt 147 lb (66.7 kg)   LMP 09/12/2019   BMI 23.02 kg/m   Appears well, in no apparent distress OB History  Gravida Para Term Preterm AB Living  2 1 1  0 0 1  SAB TAB Ectopic Multiple Live Births  0 0 0 0 1    # Outcome Date GA Lbr Len/2nd Weight Sex Delivery Anes PTL Lv  2 Current           1 Term 01/31/17 [redacted]w[redacted]d 23:21 / 00:32 8 lb (3.629 kg) M Vag-Vacuum Local  LIV    Results for orders placed or performed in visit on 10/19/19 (from the past 24 hour(s))  POCT urine pregnancy   Collection Time: 10/19/19  1:00 PM  Result Value Ref Range   Preg Test, Ur Positive (A) Negative    ASSESSMENT: Positive pregnancy test, [redacted]w[redacted]d by LMP    PLAN: Schedule for dating ultrasound in 3 weeks. Prenatal vitamins: continue   Nausea medicines: not currently needed   OB packet given: Yes  Rolena Infante  10/19/2019 1:00 PM   Chart reviewed for nurse visit. Agree with plan of care.  Roma Schanz, [place] 10/19/2019 1:45 PM

## 2019-10-22 ENCOUNTER — Other Ambulatory Visit: Payer: Self-pay | Admitting: Adult Health

## 2019-10-22 DIAGNOSIS — Z3201 Encounter for pregnancy test, result positive: Secondary | ICD-10-CM

## 2019-10-22 NOTE — Progress Notes (Signed)
Ck QHCG  

## 2019-10-23 DIAGNOSIS — Z3201 Encounter for pregnancy test, result positive: Secondary | ICD-10-CM | POA: Diagnosis not present

## 2019-10-24 ENCOUNTER — Telehealth: Payer: Self-pay | Admitting: Adult Health

## 2019-10-24 LAB — BETA HCG QUANT (REF LAB): hCG Quant: 64973 m[IU]/mL

## 2019-10-24 NOTE — Telephone Encounter (Signed)
Left message that [facility] [id number],puts her at least 7 weeks, hopefully spotting has stopped and just related to BM, has [place] appt 5/7 can call and see if any sooner appts

## 2019-11-07 ENCOUNTER — Other Ambulatory Visit: Payer: Self-pay | Admitting: Obstetrics and Gynecology

## 2019-11-07 DIAGNOSIS — O3680X0 Pregnancy with inconclusive fetal viability, not applicable or unspecified: Secondary | ICD-10-CM

## 2019-11-09 ENCOUNTER — Other Ambulatory Visit: Payer: Self-pay

## 2019-11-09 ENCOUNTER — Ambulatory Visit (INDEPENDENT_AMBULATORY_CARE_PROVIDER_SITE_OTHER): Payer: 59

## 2019-11-09 DIAGNOSIS — O3680X0 Pregnancy with inconclusive fetal viability, not applicable or unspecified: Secondary | ICD-10-CM | POA: Diagnosis not present

## 2019-11-09 DIAGNOSIS — Z3A08 8 weeks gestation of pregnancy: Secondary | ICD-10-CM

## 2019-11-09 NOTE — Progress Notes (Signed)
[place] 8+2 wks,single IUP,FHR 173 bpm,CRL 22.27 mm,normal ovaries

## 2019-12-04 ENCOUNTER — Other Ambulatory Visit: Payer: Self-pay | Admitting: Obstetrics & Gynecology

## 2019-12-04 DIAGNOSIS — Z3682 Encounter for antenatal screening for nuchal translucency: Secondary | ICD-10-CM

## 2019-12-05 ENCOUNTER — Ambulatory Visit (INDEPENDENT_AMBULATORY_CARE_PROVIDER_SITE_OTHER): Payer: 59

## 2019-12-05 ENCOUNTER — Encounter: Payer: Self-pay | Admitting: Women's Health

## 2019-12-05 ENCOUNTER — Encounter: Payer: Self-pay | Admitting: Family Medicine

## 2019-12-05 ENCOUNTER — Ambulatory Visit: Payer: 59 | Admitting: *Deleted

## 2019-12-05 ENCOUNTER — Ambulatory Visit (INDEPENDENT_AMBULATORY_CARE_PROVIDER_SITE_OTHER): Payer: 59 | Admitting: Family Medicine

## 2019-12-05 VITALS — BP 115/75 | HR 88 | Wt 148.0 lb

## 2019-12-05 DIAGNOSIS — Z3682 Encounter for antenatal screening for nuchal translucency: Secondary | ICD-10-CM | POA: Diagnosis not present

## 2019-12-05 DIAGNOSIS — Z3480 Encounter for supervision of other normal pregnancy, unspecified trimester: Secondary | ICD-10-CM | POA: Diagnosis not present

## 2019-12-05 DIAGNOSIS — Z3481 Encounter for supervision of other normal pregnancy, first trimester: Secondary | ICD-10-CM | POA: Diagnosis not present

## 2019-12-05 DIAGNOSIS — Z3A12 12 weeks gestation of pregnancy: Secondary | ICD-10-CM

## 2019-12-05 LAB — POCT URINALYSIS DIPSTICK OB
Blood, UA: NEGATIVE
Glucose, UA: NEGATIVE
Ketones, UA: NEGATIVE
Leukocytes, UA: NEGATIVE
Nitrite, UA: NEGATIVE
POC,PROTEIN,UA: NEGATIVE

## 2019-12-05 NOTE — Progress Notes (Signed)
[place] 12 wks,measurements c/w dates,CRL 66.90 mm,normal ovaries,anterior placenta,NB present,NT 1.9 mm,fhr 148 bpm

## 2019-12-05 NOTE — Progress Notes (Signed)
History:   Sara Kirby is a 29 y.o. G2P1001 at [redacted]w[redacted]d by LMP and 8 wk [place] being seen today for her first obstetrical visit.  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 Patient does intend to breast feed. Pregnancy history fully reviewed.  Patient reports no complaints today. She did have a few episodes of spotting over the last week, very minimal and once after intercourse.      HISTORY: OB History  Gravida Para Term Preterm AB Living  2 1 1  0 0 1  SAB TAB Ectopic Multiple Live Births  0 0 0 0 1    # Outcome Date GA Lbr Len/2nd Weight Sex Delivery Anes PTL Lv  2 Current           1 Term 01/31/17 [redacted]w[redacted]d 23:21 / 00:32 8 lb (3.629 kg) M Vag-Vacuum Local N LIV     Name: Shallenberger,BOY Wenda     Apgar1: 8  Apgar5: 9    Last pap smear was done July 2019 and was normal  Past Medical History:  Diagnosis Date  . Contraceptive management 04/02/2013  . Patient desires pregnancy 01/09/2016  . UTI (urinary tract infection) 02/27/2013   Past Surgical History:  Procedure Laterality Date  . URETHRAL DILATION    . WISDOM TOOTH EXTRACTION     Family History  Problem Relation Age of Onset  . Cancer Other        uterine   . Alcohol abuse Father   . Depression Father   . Hypertension Father   . Stroke Father   . Hypertension Maternal Grandmother   . Hypertension Maternal Grandfather   . Heart disease Maternal Grandfather   . COPD Maternal Grandfather    Social History   Tobacco Use  . Smoking status: Never Smoker  . Smokeless tobacco: Never Used  Substance Use Topics  . Alcohol use: Not Currently  . Drug use: No   No Known Allergies Current Outpatient Medications on File Prior to Visit  Medication Sig Dispense Refill  . Prenatal Vit-Fe Fumarate-FA (MULTIVITAMIN-PRENATAL) 27-0.8 MG TABS tablet Take 1 tablet by mouth daily at 12 noon.    . loratadine (CLARITIN) 10 MG tablet Take 10 mg by mouth daily.     No current facility-administered medications on file prior to visit.      Review of Systems Pertinent items noted in HPI and remainder of comprehensive ROS otherwise negative. Physical Exam:   Vitals:   12/05/19 1151  BP: 115/75  Pulse: 88  Weight: 148 lb (67.1 kg)   Fetal Heart Rate (bpm): 160 CONSTITUTIONAL: Well-developed, well-nourished female in no acute distress.  HEENT:  Normocephalic, atraumatic. External right and left ear normal. No scleral icterus.  NECK: Normal range of motion SKIN: No rash noted. Not diaphoretic. No erythema. No pallor. MUSCULOSKELETAL: Normal range of motion. No edema noted. NEUROLOGIC: Alert and oriented to person, place, and time. Normal muscle tone coordination. No cranial nerve deficit noted. PSYCHIATRIC: Normal mood and affect. Normal behavior. Normal judgment and thought content. CARDIOVASCULAR: Normal heart rate noted RESPIRATORY: Effort  normal, no problems with respiration noted ABDOMEN: Soft, non-tender. PELVIC: Deferred Assessment:    Pregnancy: G2P1001 Patient Active Problem List   Diagnosis Date Noted  . Encounter for supervision of other normal pregnancy, unspecified trimester 12/05/2019  . Rectal fissure 07/16/2019     Plan:    Katerine was seen today for initial prenatal visit.  Diagnoses and all orders for this visit:  Encounter for supervision of  other normal pregnancy, unspecified trimester [redacted] weeks gestation of pregnancy -     Integrated 1 -     POC Urinalysis Dipstick OB -     CHL AMB BABYSCRIPTS SCHEDULE OPTIMIZATION -     CBC/D/Plt+RPR+Rh+ABO+Rub Ab... -     GC/Chlamydia Probe Amp -     Urine Culture -     Genetic Screening -     US OB Comp + 14 Wk; Future - RTC in 4 weeks   Initial labs drawn. Continue prenatal vitamins. Problem list reviewed and updated. Genetic Screening discussed and ordered. NT done today.  Ultrasound discussed; fetal anatomic survey: Ordered.  Discussed usage of Babyscripts and virtual visits as additional source of managing and completing prenatal visits  in midst of coronavirus and pandemic.   Anticipatory guidance for prenatal visits including labs, ultrasounds, and testing; Initial labs drawn. Encouraged to complete MyChart Registration for her ability to review results, send requests, and have questions addressed.  The nature of [place] for [facility]ulty Practice with multiple MDs and Advanced Practice Providers was explained to patient; also emphasized that residents, students are part of our team. Routine obstetric precautions reviewed. Encouraged to seek out care at office or emergency room [facility] MAU preferred) for urgent and/or emergent concerns. Return in about 4 weeks (around 01/02/2020) for LOB; in person or virtual per patient preference .    Barrington Ellison, MD [facility] Family Medicine Fellow, [facility] for [employer], [place]

## 2019-12-07 LAB — CBC/D/PLT+RPR+RH+ABO+RUB AB...
Antibody Screen: NEGATIVE
Basophils Absolute: 0 10*3/uL (ref 0.0–0.2)
Basos: 1 %
EOS (ABSOLUTE): 0 10*3/uL (ref 0.0–0.4)
Eos: 0 %
HCV Ab: <0.1 s/co ratio (ref 0.0–0.9)
HIV Screen 4th Generation wRfx: NONREACTIVE
Hematocrit: 40.4 % (ref 34.0–46.6)
Hemoglobin: 13.7 g/dL (ref 11.1–15.9)
Hepatitis B Surface Ag: NEGATIVE
Immature Grans (Abs): 0 10*3/uL (ref 0.0–0.1)
Immature Granulocytes: 0 %
Lymphocytes Absolute: 1.4 10*3/uL (ref 0.7–3.1)
Lymphs: 18 %
MCH: 31.6 pg (ref 26.6–33.0)
MCHC: 33.9 g/dL (ref 31.5–35.7)
MCV: 93 fL (ref 79–97)
Monocytes Absolute: 0.6 10*3/uL (ref 0.1–0.9)
Monocytes: 8 %
Neutrophils Absolute: 6 10*3/uL (ref 1.4–7.0)
Neutrophils: 73 %
Platelets: 253 10*3/uL (ref 150–450)
RBC: 4.33 x10E6/uL (ref 3.77–5.28)
RDW: 12.6 % (ref 11.7–15.4)
RPR Ser Ql: NONREACTIVE
Rh Factor: POSITIVE
Rubella Antibodies, IGG: 3.15 index (ref >0.99)
WBC: 8.1 10*3/uL (ref 3.4–10.8)

## 2019-12-07 LAB — INTEGRATED 1
Crown Rump Length: 66.9 mm
Gest. Age on Collection Date: 12.9 weeks
Maternal Age at EDD: 30.5 yr
Nuchal Translucency (NT): 1.9 mm
Number of Fetuses: 1
PAPP-A Value: 2351.4 ng/mL
Weight: 148 [lb_av]

## 2019-12-07 LAB — GC/CHLAMYDIA PROBE AMP
Chlamydia trachomatis, NAA: NEGATIVE
Neisseria Gonorrhoeae by PCR: NEGATIVE

## 2019-12-07 LAB — URINE CULTURE

## 2019-12-07 LAB — HCV INTERPRETATION

## 2020-01-01 ENCOUNTER — Encounter: Payer: Self-pay | Admitting: Women's Health

## 2020-01-01 ENCOUNTER — Ambulatory Visit (INDEPENDENT_AMBULATORY_CARE_PROVIDER_SITE_OTHER): Payer: 59 | Admitting: Women's Health

## 2020-01-01 VITALS — BP 121/73 | HR 85 | Wt 150.5 lb

## 2020-01-01 DIAGNOSIS — Z1389 Encounter for screening for other disorder: Secondary | ICD-10-CM

## 2020-01-01 DIAGNOSIS — Z3480 Encounter for supervision of other normal pregnancy, unspecified trimester: Secondary | ICD-10-CM | POA: Diagnosis not present

## 2020-01-01 DIAGNOSIS — Z363 Encounter for antenatal screening for malformations: Secondary | ICD-10-CM

## 2020-01-01 DIAGNOSIS — Z3A15 15 weeks gestation of pregnancy: Secondary | ICD-10-CM

## 2020-01-01 DIAGNOSIS — Z1379 Encounter for other screening for genetic and chromosomal anomalies: Secondary | ICD-10-CM | POA: Diagnosis not present

## 2020-01-01 DIAGNOSIS — Z3482 Encounter for supervision of other normal pregnancy, second trimester: Secondary | ICD-10-CM

## 2020-01-01 DIAGNOSIS — Z331 Pregnant state, incidental: Secondary | ICD-10-CM

## 2020-01-01 LAB — POCT URINALYSIS DIPSTICK OB
Blood, UA: NEGATIVE
Glucose, UA: NEGATIVE
Ketones, UA: NEGATIVE
Leukocytes, UA: NEGATIVE
Nitrite, UA: NEGATIVE
POC,PROTEIN,UA: NEGATIVE

## 2020-01-01 NOTE — Progress Notes (Signed)
LOW-RISK PREGNANCY VISIT Patient name: Sara Kirby MRN [id number]  Date of birth: [birth year] Chief Complaint:   Routine Prenatal Visit (2nd IT)  History of Present Illness:   Sara Kirby is a 29 y.o. G99P1001 female at [redacted]w[redacted]d with an Estimated Date of Delivery: 06/18/20 being seen today for ongoing management of a low-risk pregnancy.  Depression screen [facility] 2/9 12/05/2019 07/07/2016 06/10/2016  Decreased Interest 0 0 0  Down, Depressed, Hopeless 0 0 0  PHQ - 2 Score 0 0 0  Altered sleeping 0 0 -  Tired, decreased energy 1 1 -  Change in appetite 0 0 -  Feeling bad or failure about yourself  0 0 -  Trouble concentrating 0 0 -  Moving slowly or fidgety/restless 0 0 -  Suicidal thoughts 0 0 -  PHQ-9 Score 1 1 -  Difficult doing work/chores Not difficult at all - -  Some encounter information is confidential and restricted. Go to Review Flowsheets activity to see all data.    Today she reports no complaints. Some occ round ligament pain.  . Vag. Bleeding: None.  Movement: Absent. denies leaking of fluid. Review of Systems:   Pertinent items are noted in HPI Denies abnormal vaginal discharge w/ itching/odor/irritation, headaches, visual changes, shortness of breath, chest pain, abdominal pain, severe nausea/vomiting, or problems with urination or bowel movements unless otherwise stated above. Pertinent History Reviewed:  Reviewed past medical,surgical, social, obstetrical and family history.  Reviewed problem list, medications and allergies. Physical Assessment:   Vitals:   01/01/20 0913  BP: 121/73  Pulse: 85  Weight: 150 lb 8 oz (68.3 kg)  Body mass index is 23.57 kg/m.        Physical Examination:   General appearance: Well appearing, and in no distress  Mental status: Alert, oriented to person, place, and time  Skin: Warm & dry  Cardiovascular: Normal heart rate noted  Respiratory: Normal respiratory effort, no distress  Abdomen: Soft, gravid, nontender  Pelvic: Cervical  exam deferred         Extremities: Edema: None  Fetal Status: Fetal Heart Rate (bpm): 155   Movement: Absent    Chaperone: n/a    Results for orders placed or performed in visit on 01/01/20 (from the past 24 hour(s))  POC Urinalysis Dipstick OB   Collection Time: 01/01/20  9:15 AM  Result Value Ref Range   Color, UA     Clarity, UA     Glucose, UA Negative Negative   Bilirubin, UA     Ketones, UA neg    Spec Grav, UA     Blood, UA neg    pH, UA     POC,PROTEIN,UA Negative Negative, Trace, Small (1+), Moderate (2+), Large (3+), 4+   Urobilinogen, UA     Nitrite, UA neg    Leukocytes, UA Negative Negative   Appearance     Odor      Assessment & Plan:  1) Low-risk pregnancy G2P1001 at [redacted]w[redacted]d with an Estimated Date of Delivery: 06/18/20    Meds: No orders of the defined types were placed in this encounter.  Labs/procedures today: 2nd IT  Plan:  Continue routine obstetrical care  Next visit: prefers will be in person for anatomy u/s    Reviewed: Preterm labor symptoms and general obstetric precautions including but not limited to vaginal bleeding, contractions, leaking of fluid and fetal movement were reviewed in detail with the patient.  All questions were answered. Has home bp cuff.  Check  bp weekly, let us know if >140/90.   Follow-up: Return in about 3 weeks (around 01/22/2020) for [place], EF:[facility], in person, CNM.  Orders Placed This Encounter  Procedures  . US OB Comp + 14 Wk  . INTEGRATED 2  . POC Urinalysis Dipstick OB   Roma Schanz CNM, [facility] 01/01/2020 9:30 AM

## 2020-01-01 NOTE — Patient Instructions (Signed)
Sara Kirby, I greatly value your feedback.  If you receive a survey following your visit with [place] today, we appreciate you taking the time to fill it out.  Thanks, Knute Neu, CNM, WHNP-BC  [facility] [place] [facility] ([place], [place] [address]) Entrance C, located off of [address] parking  Go to [email or web address] to register for FREE online childbirth classes  [place] Pediatricians/Family Doctors:  [facility] [place] [phone]                 [place] [phone] (usually not accepting new patients unless you have family there already, you are always welcome to call and ask)       [facility] Department [phone]       [facility] Pediatricians/Family Doctors:   [facility]: [phone]  [facility]: [phone]  [facility]: [place] Doctors:   [facility]: [place]: [place]:  [place]: [phone]    Home Blood Pressure Monitoring for Patients   Your provider has recommended that you check your blood pressure (BP) at least once a week at home. If you do not have a blood pressure cuff at home, one will be provided for you. Contact your provider if you have not received your monitor within 1 week.   Helpful Tips for Accurate Home Blood Pressure Checks  . Don't smoke, exercise, or drink caffeine 30 minutes before checking your BP . Use the restroom before checking your BP (a full bladder can raise your pressure) . Relax in a comfortable upright chair . Feet on the ground . Left arm resting comfortably on a flat surface at the level of your heart . Legs uncrossed . Back supported . Sit quietly and don't talk . Place the cuff on your bare arm . Adjust snuggly, so  that only two fingertips can fit between your skin and the top of the cuff . Check 2 readings separated by at least one minute . Keep a log of your BP readings . For a visual, please reference this diagram: http://ccnc.care/bpdiagram  Provider Name: [facility]     Phone: [phone]  Zone 1: ALL CLEAR  Continue to monitor your symptoms:  . BP reading is less than 140 (top number) or less than 90 (bottom number)  . No right upper stomach pain . No headaches or seeing spots . No feeling nauseated or throwing up . No swelling in face and hands  Zone 2: CAUTION Call your doctor's office for any of the following:  . BP reading is greater than 140 (top number) or greater than 90 (bottom number)  . Stomach pain under your ribs in the middle or right side . Headaches or seeing spots . Feeling nauseated or throwing up . Swelling in face and hands  Zone 3: EMERGENCY  Seek immediate medical care if you have any of the following:  . BP reading is greater than160 (top number) or greater than 110 (bottom number) . Severe headaches not improving with Tylenol . Serious difficulty catching your breath . Any worsening symptoms from Zone 2     Second Trimester of Pregnancy The second trimester is from week 14 through week 27 (months 4 through 6). The second trimester is often a time when you feel your  best. Your body has adjusted to being pregnant, and you begin to feel better physically. Usually, morning sickness has lessened or quit completely, you may have more energy, and you may have an increase in appetite. The second trimester is also a time when the fetus is growing rapidly. At the end of the sixth month, the fetus is about 9 inches long and weighs about 1 pounds. You will likely begin to feel the baby move (quickening) between 16 and 20 weeks of pregnancy. Body changes during your second trimester Your body continues to go through many changes during your second trimester. The  changes vary from woman to woman.  Your weight will continue to increase. You will notice your lower abdomen bulging out.  You may begin to get stretch marks on your hips, abdomen, and breasts.  You may develop headaches that can be relieved by medicines. The medicines should be approved by your health care provider.  You may urinate more often because the fetus is pressing on your bladder.  You may develop or continue to have heartburn as a result of your pregnancy.  You may develop constipation because certain hormones are causing the muscles that push waste through your intestines to slow down.  You may develop hemorrhoids or swollen, bulging veins (varicose veins).  You may have back pain. This is caused by: ? Weight gain. ? Pregnancy hormones that are relaxing the joints in your pelvis. ? A shift in weight and the muscles that support your balance.  Your breasts will continue to grow and they will continue to become tender.  Your gums may bleed and may be sensitive to brushing and flossing.  Dark spots or blotches (chloasma, mask of pregnancy) may develop on your face. This will likely fade after the baby is born.  A dark line from your belly button to the pubic area (linea nigra) may appear. This will likely fade after the baby is born.  You may have changes in your hair. These can include thickening of your hair, rapid growth, and changes in texture. Some women also have hair loss during or after pregnancy, or hair that feels dry or thin. Your hair will most likely return to normal after your baby is born.  What to expect at prenatal visits During a routine prenatal visit:  You will be weighed to make sure you and the fetus are growing normally.  Your blood pressure will be taken.  Your abdomen will be measured to track your baby's growth.  The fetal heartbeat will be listened to.  Any test results from the previous visit will be discussed.  Your health care  provider may ask you:  How you are feeling.  If you are feeling the baby move.  If you have had any abnormal symptoms, such as leaking fluid, bleeding, severe headaches, or abdominal cramping.  If you are using any tobacco products, including cigarettes, chewing tobacco, and electronic cigarettes.  If you have any questions.  Other tests that may be performed during your second trimester include:  Blood tests that check for: ? Low iron levels (anemia). ? High blood sugar that affects pregnant women (gestational diabetes) between 71 and 28 weeks. ? Rh antibodies. This is to check for a protein on red blood cells (Rh factor).  Urine tests to check for infections, diabetes, or protein in the urine.  An ultrasound to confirm the proper growth and development of the baby.  An amniocentesis to check for possible genetic problems.  Fetal  screens for spina bifida and Down syndrome.  HIV (human immunodeficiency virus) testing. Routine prenatal testing includes screening for HIV, unless you choose not to have this test.  Follow these instructions at home: Medicines  Follow your health care provider's instructions regarding medicine use. Specific medicines may be either safe or unsafe to take during pregnancy.  Take a prenatal vitamin that contains at least 600 micrograms (mcg) of folic acid.  If you develop constipation, try taking a stool softener if your health care provider approves. Eating and drinking  Eat a balanced diet that includes fresh fruits and vegetables, whole grains, good sources of protein such as meat, eggs, or tofu, and low-fat dairy. Your health care provider will help you determine the amount of weight gain that is right for you.  Avoid raw meat and uncooked cheese. These carry germs that can cause birth defects in the baby.  If you have low calcium intake from food, talk to your health care provider about whether you should take a daily calcium  supplement.  Limit foods that are high in fat and processed sugars, such as fried and sweet foods.  To prevent constipation: ? Drink enough fluid to keep your urine clear or pale yellow. ? Eat foods that are high in fiber, such as fresh fruits and vegetables, whole grains, and beans. Activity  Exercise only as directed by your health care provider. Most women can continue their usual exercise routine during pregnancy. Try to exercise for 30 minutes at least 5 days a week. Stop exercising if you experience uterine contractions.  Avoid heavy lifting, wear low heel shoes, and practice good posture.  A sexual relationship may be continued unless your health care provider directs you otherwise. Relieving pain and discomfort  Wear a good support bra to prevent discomfort from breast tenderness.  Take warm sitz baths to soothe any pain or discomfort caused by hemorrhoids. Use hemorrhoid cream if your health care provider approves.  Rest with your legs elevated if you have leg cramps or low back pain.  If you develop varicose veins, wear support hose. Elevate your feet for 15 minutes, 3-4 times a day. Limit salt in your diet. Prenatal Care  Write down your questions. Take them to your prenatal visits.  Keep all your prenatal visits as told by your health care provider. This is important. Safety  Wear your seat belt at all times when driving.  Make a list of emergency phone numbers, including numbers for family, friends, the hospital, and police and fire departments. General instructions  Ask your health care provider for a referral to a local prenatal education class. Begin classes no later than the beginning of month 6 of your pregnancy.  Ask for help if you have counseling or nutritional needs during pregnancy. Your health care provider can offer advice or refer you to specialists for help with various needs.  Do not use hot tubs, steam rooms, or saunas.  Do not douche or use  tampons or scented sanitary pads.  Do not cross your legs for long periods of time.  Avoid cat litter boxes and soil used by cats. These carry germs that can cause birth defects in the baby and possibly loss of the fetus by miscarriage or stillbirth.  Avoid all smoking, herbs, alcohol, and unprescribed drugs. Chemicals in these products can affect the formation and growth of the baby.  Do not use any products that contain nicotine or tobacco, such as cigarettes and e-cigarettes. If you need help  quitting, ask your health care provider.  Visit your dentist if you have not gone yet during your pregnancy. Use a soft toothbrush to brush your teeth and be gentle when you floss. Contact a health care provider if:  You have dizziness.  You have mild pelvic cramps, pelvic pressure, or nagging pain in the abdominal area.  You have persistent nausea, vomiting, or diarrhea.  You have a bad smelling vaginal discharge.  You have pain when you urinate. Get help right away if:  You have a fever.  You are leaking fluid from your vagina.  You have spotting or bleeding from your vagina.  You have severe abdominal cramping or pain.  You have rapid weight gain or weight loss.  You have shortness of breath with chest pain.  You notice sudden or extreme swelling of your face, hands, ankles, feet, or legs.  You have not felt your baby move in over an hour.  You have severe headaches that do not go away when you take medicine.  You have vision changes. Summary  The second trimester is from week 14 through week 27 (months 4 through 6). It is also a time when the fetus is growing rapidly.  Your body goes through many changes during pregnancy. The changes vary from woman to woman.  Avoid all smoking, herbs, alcohol, and unprescribed drugs. These chemicals affect the formation and growth your baby.  Do not use any tobacco products, such as cigarettes, chewing tobacco, and e-cigarettes. If you  need help quitting, ask your health care provider.  Contact your health care provider if you have any questions. Keep all prenatal visits as told by your health care provider. This is important. This information is not intended to replace advice given to you by your health care provider. Make sure you discuss any questions you have with your health care provider. Document Released: 06/15/2001 Document Revised: 11/27/2015 Document Reviewed: 08/22/2012 Elsevier Interactive Patient Education  2017 [place] FLU! Because you are pregnant, we at [facility], along with the Centers for Disease Control (CDC), recommend that you receive the flu vaccine to protect yourself and your baby from the flu. The flu is more likely to cause severe illness in pregnant women than in women of reproductive age who are not pregnant. Changes in the immune system, heart, and lungs during pregnancy make pregnant women (and women up to two weeks postpartum) more prone to severe illness from flu, including illness resulting in hospitalization. Flu also may be harmful for a pregnant woman's developing baby. A common flu symptom is fever, which may be associated with neural tube defects and other adverse outcomes for a developing baby. Getting vaccinated can also help protect a baby after birth from flu. (Mom passes antibodies onto the developing baby during her pregnancy.)  A Flu Vaccine is the Best Protection Against Flu Getting a flu vaccine is the first and most important step in protecting against flu. Pregnant women should get a flu shot and not the live attenuated influenza vaccine (LAIV), also known as nasal spray flu vaccine. Flu vaccines given during pregnancy help protect both the mother and her baby from flu. Vaccination has been shown to reduce the risk of flu-associated acute respiratory infection in pregnant women by up to one-half. A 2018 study showed that getting a flu shot  reduced a pregnant woman's risk of being hospitalized with flu by an average of 40 percent. Pregnant women who get a  flu vaccine are also helping to protect their babies from flu illness for the first several months after their birth, when they are too young to get vaccinated.   A Long Record of Safety for Flu Shots in Pregnant Women Flu shots have been given to millions of pregnant women over many years with a good safety record. There is a lot of evidence that flu vaccines can be given safely during pregnancy; though these data are limited for the first trimester. The CDC recommends that pregnant women get vaccinated during any trimester of their pregnancy. It is very important for pregnant women to get the flu shot.   Other Preventive Actions In addition to getting a flu shot, pregnant women should take the same everyday preventive actions the CDC recommends of everyone, including covering coughs, washing hands often, and avoiding people who are sick.  Symptoms and Treatment If you get sick with flu symptoms call your doctor right away. There are antiviral drugs that can treat flu illness and prevent serious flu complications. The CDC recommends prompt treatment for people who have influenza infection or suspected influenza infection and who are at high risk of serious flu complications, such as people with asthma, diabetes (including gestational diabetes), or heart disease. Early treatment of influenza in hospitalized pregnant women has been shown to reduce the length of the hospital stay.  Symptoms Flu symptoms include fever, cough, sore throat, runny or stuffy nose, body aches, headache, chills and fatigue. Some people may also have vomiting and diarrhea. People may be infected with the flu and have respiratory symptoms without a fever.  Early Treatment is Important for Pregnant Women Treatment should begin as soon as possible because antiviral drugs work best when started early (within 48 hours  after symptoms start). Antiviral drugs can make your flu illness milder and make you feel better faster. They may also prevent serious health problems that can result from flu illness. Oral oseltamivir (Tamiflu) is the preferred treatment for pregnant women because it has the most studies available to suggest that it is safe and beneficial. Antiviral drugs require a prescription from your provider. Having a fever caused by flu infection or other infections early in pregnancy may be linked to birth defects in a baby. In addition to taking antiviral drugs, pregnant women who get a fever should treat their fever with Tylenol (acetaminophen) and contact their provider immediately.  When to [place] If you are pregnant and have any of these signs, seek care immediately:  Difficulty breathing or shortness of breath  Pain or pressure in the chest or abdomen  Sudden dizziness  Confusion  Severe or persistent vomiting  High fever that is not responding to Tylenol (or store brand equivalent)  Decreased or no movement of your baby  [email or web address]

## 2020-01-03 LAB — INTEGRATED 2
AFP MoM: 1.29
Alpha-Fetoprotein: 46.3 ng/mL
Crown Rump Length: 66.9 mm
DIA MoM: 1.05
DIA Value: 163.6 pg/mL
Estriol, Unconjugated: 1.23 ng/mL
Gest. Age on Collection Date: 12.9 weeks
Gestational Age: 16.7 weeks
Maternal Age at EDD: 30.5 yr
Nuchal Translucency (NT): 1.9 mm
Nuchal Translucency MoM: 1.26
Number of Fetuses: 1
PAPP-A MoM: 2.02
PAPP-A Value: 2351.4 ng/mL
Test Results:: NEGATIVE
Weight: 148 [lb_av]
Weight: 148 [lb_av]
hCG MoM: 2.22
hCG Value: 68.1 IU/mL
uE3 MoM: 1.13

## 2020-01-22 ENCOUNTER — Other Ambulatory Visit: Payer: Self-pay

## 2020-01-22 ENCOUNTER — Other Ambulatory Visit: Payer: Self-pay | Admitting: Women's Health

## 2020-01-22 ENCOUNTER — Ambulatory Visit (INDEPENDENT_AMBULATORY_CARE_PROVIDER_SITE_OTHER): Payer: 59 | Admitting: Women's Health

## 2020-01-22 ENCOUNTER — Encounter: Payer: Self-pay | Admitting: Women's Health

## 2020-01-22 ENCOUNTER — Ambulatory Visit (INDEPENDENT_AMBULATORY_CARE_PROVIDER_SITE_OTHER): Payer: 59

## 2020-01-22 VITALS — BP 116/71 | HR 87 | Wt 157.0 lb

## 2020-01-22 DIAGNOSIS — Z3482 Encounter for supervision of other normal pregnancy, second trimester: Secondary | ICD-10-CM

## 2020-01-22 DIAGNOSIS — Z363 Encounter for antenatal screening for malformations: Secondary | ICD-10-CM | POA: Diagnosis not present

## 2020-01-22 DIAGNOSIS — Z3A18 18 weeks gestation of pregnancy: Secondary | ICD-10-CM | POA: Diagnosis not present

## 2020-01-22 DIAGNOSIS — Z1389 Encounter for screening for other disorder: Secondary | ICD-10-CM

## 2020-01-22 DIAGNOSIS — Z0375 Encounter for suspected cervical shortening ruled out: Secondary | ICD-10-CM | POA: Diagnosis not present

## 2020-01-22 DIAGNOSIS — O3442 Maternal care for other abnormalities of cervix, second trimester: Secondary | ICD-10-CM

## 2020-01-22 DIAGNOSIS — Z3480 Encounter for supervision of other normal pregnancy, unspecified trimester: Secondary | ICD-10-CM

## 2020-01-22 DIAGNOSIS — Z331 Pregnant state, incidental: Secondary | ICD-10-CM

## 2020-01-22 LAB — POCT URINALYSIS DIPSTICK OB
Blood, UA: NEGATIVE
Glucose, UA: NEGATIVE
Ketones, UA: NEGATIVE
Leukocytes, UA: NEGATIVE
Nitrite, UA: NEGATIVE
POC,PROTEIN,UA: NEGATIVE

## 2020-01-22 NOTE — Progress Notes (Signed)
LOW-RISK PREGNANCY VISIT Patient name: Sara LADAJA Kirby MRN [id number]  Date of birth: 1990-11-16 Chief Complaint:   Routine Prenatal Visit (Ultrasound)  History of Present Illness:   Lynne M Briel is a 29 y.o. G79P1001 female at [redacted]w[redacted]d with an Estimated Date of Delivery: 06/18/20 being seen today for ongoing management of a low-risk pregnancy.  Depression screen [facility] 2/9 12/05/2019 07/07/2016 06/10/2016  Decreased Interest 0 0 0  Down, Depressed, Hopeless 0 0 0  PHQ - 2 Score 0 0 0  Altered sleeping 0 0 -  Tired, decreased energy 1 1 -  Change in appetite 0 0 -  Feeling bad or failure about yourself  0 0 -  Trouble concentrating 0 0 -  Moving slowly or fidgety/restless 0 0 -  Suicidal thoughts 0 0 -  PHQ-9 Score 1 1 -  Difficult doing work/chores Not difficult at all - -  Some encounter information is confidential and restricted. Go to Review Flowsheets activity to see all data.    Today she reports no complaints. Contractions: Not present. Vag. Bleeding: None.  Movement: Present. denies leaking of fluid. Review of Systems:   Pertinent items are noted in HPI Denies abnormal vaginal discharge w/ itching/odor/irritation, headaches, visual changes, shortness of breath, chest pain, abdominal pain, severe nausea/vomiting, or problems with urination or bowel movements unless otherwise stated above. Pertinent History Reviewed:  Reviewed past medical,surgical, social, obstetrical and family history.  Reviewed problem list, medications and allergies. Physical Assessment:   Vitals:   01/22/20 1152  BP: 116/71  Pulse: 87  Weight: 157 lb (71.2 kg)  Body mass index is 24.59 kg/m.        Physical Examination:   General appearance: Well appearing, and in no distress  Mental status: Alert, oriented to person, place, and time  Skin: Warm & dry  Cardiovascular: Normal heart rate noted  Respiratory: Normal respiratory effort, no distress  Abdomen: Soft, gravid, nontender  Pelvic: Cervical exam  deferred         Extremities: Edema: None  Fetal Status:     Movement: Present   [place] 18+6 wks,breech,anterior placenta gr 0,normal ovaries,cervical length on transabdominal images 2-2.2 cm,transvaginal images 2.5 cm with and w/o pressure,svp of fluid 4.7 cm,fhr 159 bpm,EFW 318 g 93%,anatomy complete,discussed results with Knute Neu  Chaperone: n/a    Results for orders placed or performed in visit on 01/22/20 (from the past 24 hour(s))  POC Urinalysis Dipstick OB   Collection Time: 01/22/20 11:37 AM  Result Value Ref Range   Color, UA     Clarity, UA     Glucose, UA Negative Negative   Bilirubin, UA     Ketones, UA n    Spec Grav, UA     Blood, UA n    pH, UA     POC,PROTEIN,UA Negative Negative, Trace, Small (1+), Moderate (2+), Large (3+), 4+   Urobilinogen, UA     Nitrite, UA n    Leukocytes, UA Negative Negative   Appearance     Odor      Assessment & Plan:  1) Low-risk pregnancy G2P1001 at [redacted]w[redacted]d with an Estimated Date of Delivery: 06/18/20   2) Borderline cervical length, 2-2.2cm transabdominal, 2.5cm transvaginal, will repeat in 2wks   Meds: No orders of the defined types were placed in this encounter.  Labs/procedures today: anatomy u/s  Plan:  Continue routine obstetrical care  Next visit: prefers in person    Reviewed: Preterm labor symptoms and general obstetric precautions including  but not limited to vaginal bleeding, contractions, leaking of fluid and fetal movement were reviewed in detail with the patient.  All questions were answered. Has home bp cuff.  Check bp weekly, let us know if >140/90.   Follow-up: Return in about 2 weeks (around 02/05/2020) for LROB, US:OB F/U cx, MD or CNM.  Orders Placed This Encounter  Procedures  . US OB Limited  . POC Urinalysis Dipstick OB   Roma Schanz CNM, [facility] 01/22/2020 12:09 PM

## 2020-01-22 NOTE — Progress Notes (Signed)
[place] 18+6 wks,breech,anterior placenta gr 0,normal ovaries,cervical length on transabdominal images 2-2.2 cm,transvaginal images 2.5 cm with and w/o pressure,svp of fluid 4.7 cm,fhr 159 bpm,EFW 318 g 93%,anatomy complete,discussed results with Knute Neu

## 2020-01-22 NOTE — Patient Instructions (Signed)
Sara Kirby, I greatly value your feedback.  If you receive a survey following your visit with [place] today, we appreciate you taking the time to fill it out.  Thanks, Knute Neu, CNM, WHNP-BC  [facility] [place] [facility] ([place], [place] [id number]) Entrance C, located off of [address] parking  Go to [email or web address] to register for FREE online childbirth classes  [place] Pediatricians/Family Doctors:  [facility] [place] [phone]                 [place] [phone] (usually not accepting new patients unless you have family there already, you are always welcome to call and ask)       [facility] Department [phone]       [facility] Pediatricians/Family Doctors:   [facility]: [phone]  [facility]: [phone]  [facility]: [place] Doctors:   [facility]: [facility]: [place]:  [place]: [phone]    Home Blood Pressure Monitoring for Patients   Your provider has recommended that you check your blood pressure (BP) at least once a week at home. If you do not have a blood pressure cuff at home, one will be provided for you. Contact your provider if you have not received your monitor within 1 week.   Helpful Tips for Accurate Home Blood Pressure Checks   Don't smoke, exercise, or drink caffeine 30 minutes before checking your BP  Use the restroom before checking your BP (a full bladder can raise your pressure)  Relax in a comfortable upright chair  Feet on the ground  Left arm resting comfortably on a flat surface at the level of your heart  Legs uncrossed  Back supported  Sit quietly and don't talk  Place the cuff on your bare arm  Adjust snuggly, so  that only two fingertips can fit between your skin and the top of the cuff  Check 2 readings separated by at least one minute  Keep a log of your BP readings  For a visual, please reference this diagram: http://ccnc.care/bpdiagram  Provider Name: [facility]     Phone: [phone]  Zone 1: ALL CLEAR  Continue to monitor your symptoms:   BP reading is less than 140 (top number) or less than 90 (bottom number)   No right upper stomach pain  No headaches or seeing spots  No feeling nauseated or throwing up  No swelling in face and hands  Zone 2: CAUTION Call your doctor's office for any of the following:   BP reading is greater than 140 (top number) or greater than 90 (bottom number)   Stomach pain under your ribs in the middle or right side  Headaches or seeing spots  Feeling nauseated or throwing up  Swelling in face and hands  Zone 3: EMERGENCY  Seek immediate medical care if you have any of the following:   BP reading is greater than160 (top number) or greater than 110 (bottom number)  Severe headaches not improving with Tylenol  Serious difficulty catching your breath  Any worsening symptoms from Zone 2     Second Trimester of Pregnancy The second trimester is from week 14 through week 27 (months 4 through 6). The second trimester is often a time when you feel your  best. Your body has adjusted to being pregnant, and you begin to feel better physically. Usually, morning sickness has lessened or quit completely, you may have more energy, and you may have an increase in appetite. The second trimester is also a time when the fetus is growing rapidly. At the end of the sixth month, the fetus is about 9 inches long and weighs about 1 pounds. You will likely begin to feel the baby move (quickening) between 16 and 20 weeks of pregnancy. Body changes during your second trimester Your body continues to go through many changes during your second trimester. The  changes vary from woman to woman.  Your weight will continue to increase. You will notice your lower abdomen bulging out.  You may begin to get stretch marks on your hips, abdomen, and breasts.  You may develop headaches that can be relieved by medicines. The medicines should be approved by your health care provider.  You may urinate more often because the fetus is pressing on your bladder.  You may develop or continue to have heartburn as a result of your pregnancy.  You may develop constipation because certain hormones are causing the muscles that push waste through your intestines to slow down.  You may develop hemorrhoids or swollen, bulging veins (varicose veins).  You may have back pain. This is caused by: ? Weight gain. ? Pregnancy hormones that are relaxing the joints in your pelvis. ? A shift in weight and the muscles that support your balance.  Your breasts will continue to grow and they will continue to become tender.  Your gums may bleed and may be sensitive to brushing and flossing.  Dark spots or blotches (chloasma, mask of pregnancy) may develop on your face. This will likely fade after the baby is born.  A dark line from your belly button to the pubic area (linea nigra) may appear. This will likely fade after the baby is born.  You may have changes in your hair. These can include thickening of your hair, rapid growth, and changes in texture. Some women also have hair loss during or after pregnancy, or hair that feels dry or thin. Your hair will most likely return to normal after your baby is born.  What to expect at prenatal visits During a routine prenatal visit:  You will be weighed to make sure you and the fetus are growing normally.  Your blood pressure will be taken.  Your abdomen will be measured to track your baby's growth.  The fetal heartbeat will be listened to.  Any test results from the previous visit will be discussed.  Your health care  provider may ask you:  How you are feeling.  If you are feeling the baby move.  If you have had any abnormal symptoms, such as leaking fluid, bleeding, severe headaches, or abdominal cramping.  If you are using any tobacco products, including cigarettes, chewing tobacco, and electronic cigarettes.  If you have any questions.  Other tests that may be performed during your second trimester include:  Blood tests that check for: ? Low iron levels (anemia). ? High blood sugar that affects pregnant women (gestational diabetes) between 52 and 28 weeks. ? Rh antibodies. This is to check for a protein on red blood cells (Rh factor).  Urine tests to check for infections, diabetes, or protein in the urine.  An ultrasound to confirm the proper growth and development of the baby.  An amniocentesis to check for possible genetic problems.  Fetal  screens for spina bifida and Down syndrome.  HIV (human immunodeficiency virus) testing. Routine prenatal testing includes screening for HIV, unless you choose not to have this test.  Follow these instructions at home: Medicines  Follow your health care provider's instructions regarding medicine use. Specific medicines may be either safe or unsafe to take during pregnancy.  Take a prenatal vitamin that contains at least 600 micrograms (mcg) of folic acid.  If you develop constipation, try taking a stool softener if your health care provider approves. Eating and drinking  Eat a balanced diet that includes fresh fruits and vegetables, whole grains, good sources of protein such as meat, eggs, or tofu, and low-fat dairy. Your health care provider will help you determine the amount of weight gain that is right for you.  Avoid raw meat and uncooked cheese. These carry germs that can cause birth defects in the baby.  If you have low calcium intake from food, talk to your health care provider about whether you should take a daily calcium  supplement.  Limit foods that are high in fat and processed sugars, such as fried and sweet foods.  To prevent constipation: ? Drink enough fluid to keep your urine clear or pale yellow. ? Eat foods that are high in fiber, such as fresh fruits and vegetables, whole grains, and beans. Activity  Exercise only as directed by your health care provider. Most women can continue their usual exercise routine during pregnancy. Try to exercise for 30 minutes at least 5 days a week. Stop exercising if you experience uterine contractions.  Avoid heavy lifting, wear low heel shoes, and practice good posture.  A sexual relationship may be continued unless your health care provider directs you otherwise. Relieving pain and discomfort  Wear a good support bra to prevent discomfort from breast tenderness.  Take warm sitz baths to soothe any pain or discomfort caused by hemorrhoids. Use hemorrhoid cream if your health care provider approves.  Rest with your legs elevated if you have leg cramps or low back pain.  If you develop varicose veins, wear support hose. Elevate your feet for 15 minutes, 3-4 times a day. Limit salt in your diet. Prenatal Care  Write down your questions. Take them to your prenatal visits.  Keep all your prenatal visits as told by your health care provider. This is important. Safety  Wear your seat belt at all times when driving.  Make a list of emergency phone numbers, including numbers for family, friends, the hospital, and police and fire departments. General instructions  Ask your health care provider for a referral to a local prenatal education class. Begin classes no later than the beginning of month 6 of your pregnancy.  Ask for help if you have counseling or nutritional needs during pregnancy. Your health care provider can offer advice or refer you to specialists for help with various needs.  Do not use hot tubs, steam rooms, or saunas.  Do not douche or use  tampons or scented sanitary pads.  Do not cross your legs for long periods of time.  Avoid cat litter boxes and soil used by cats. These carry germs that can cause birth defects in the baby and possibly loss of the fetus by miscarriage or stillbirth.  Avoid all smoking, herbs, alcohol, and unprescribed drugs. Chemicals in these products can affect the formation and growth of the baby.  Do not use any products that contain nicotine or tobacco, such as cigarettes and e-cigarettes. If you need help  quitting, ask your health care provider.  Visit your dentist if you have not gone yet during your pregnancy. Use a soft toothbrush to brush your teeth and be gentle when you floss. Contact a health care provider if:  You have dizziness.  You have mild pelvic cramps, pelvic pressure, or nagging pain in the abdominal area.  You have persistent nausea, vomiting, or diarrhea.  You have a bad smelling vaginal discharge.  You have pain when you urinate. Get help right away if:  You have a fever.  You are leaking fluid from your vagina.  You have spotting or bleeding from your vagina.  You have severe abdominal cramping or pain.  You have rapid weight gain or weight loss.  You have shortness of breath with chest pain.  You notice sudden or extreme swelling of your face, hands, ankles, feet, or legs.  You have not felt your baby move in over an hour.  You have severe headaches that do not go away when you take medicine.  You have vision changes. Summary  The second trimester is from week 14 through week 27 (months 4 through 6). It is also a time when the fetus is growing rapidly.  Your body goes through many changes during pregnancy. The changes vary from woman to woman.  Avoid all smoking, herbs, alcohol, and unprescribed drugs. These chemicals affect the formation and growth your baby.  Do not use any tobacco products, such as cigarettes, chewing tobacco, and e-cigarettes. If you  need help quitting, ask your health care provider.  Contact your health care provider if you have any questions. Keep all prenatal visits as told by your health care provider. This is important. This information is not intended to replace advice given to you by your health care provider. Make sure you discuss any questions you have with your health care provider. Document Released: 06/15/2001 Document Revised: 11/27/2015 Document Reviewed: 08/22/2012 Elsevier Interactive Patient Education  2017 [place] FLU! Because you are pregnant, we at [facility], along with the Centers for Disease Control (CDC), recommend that you receive the flu vaccine to protect yourself and your baby from the flu. The flu is more likely to cause severe illness in pregnant women than in women of reproductive age who are not pregnant. Changes in the immune system, heart, and lungs during pregnancy make pregnant women (and women up to two weeks postpartum) more prone to severe illness from flu, including illness resulting in hospitalization. Flu also may be harmful for a pregnant womans developing baby. A common flu symptom is fever, which may be associated with neural tube defects and other adverse outcomes for a developing baby. Getting vaccinated can also help protect a baby after birth from flu. (Mom passes antibodies onto the developing baby during her pregnancy.)  A Flu Vaccine is the Best Protection Against Flu Getting a flu vaccine is the first and most important step in protecting against flu. Pregnant women should get a flu shot and not the live attenuated influenza vaccine (LAIV), also known as nasal spray flu vaccine. Flu vaccines given during pregnancy help protect both the mother and her baby from flu. Vaccination has been shown to reduce the risk of flu-associated acute respiratory infection in pregnant women by up to one-half. A 2018 study showed that getting a flu shot  reduced a pregnant womans risk of being hospitalized with flu by an average of 40 percent. Pregnant women who get a  flu vaccine are also helping to protect their babies from flu illness for the first several months after their birth, when they are too young to get vaccinated.   A Long Record of Safety for Flu Shots in Pregnant Women Flu shots have been given to millions of pregnant women over many years with a good safety record. There is a lot of evidence that flu vaccines can be given safely during pregnancy; though these data are limited for the first trimester. The CDC recommends that pregnant women get vaccinated during any trimester of their pregnancy. It is very important for pregnant women to get the flu shot.   Other Preventive Actions In addition to getting a flu shot, pregnant women should take the same everyday preventive actions the CDC recommends of everyone, including covering coughs, washing hands often, and avoiding people who are sick.  Symptoms and Treatment If you get sick with flu symptoms call your doctor right away. There are antiviral drugs that can treat flu illness and prevent serious flu complications. The CDC recommends prompt treatment for people who have influenza infection or suspected influenza infection and who are at high risk of serious flu complications, such as people with asthma, diabetes (including gestational diabetes), or heart disease. Early treatment of influenza in hospitalized pregnant women has been shown to reduce the length of the hospital stay.  Symptoms Flu symptoms include fever, cough, sore throat, runny or stuffy nose, body aches, headache, chills and fatigue. Some people may also have vomiting and diarrhea. People may be infected with the flu and have respiratory symptoms without a fever.  Early Treatment is Important for Pregnant Women Treatment should begin as soon as possible because antiviral drugs work best when started early (within 48 hours  after symptoms start). Antiviral drugs can make your flu illness milder and make you feel better faster. They may also prevent serious health problems that can result from flu illness. Oral oseltamivir (Tamiflu) is the preferred treatment for pregnant women because it has the most studies available to suggest that it is safe and beneficial. Antiviral drugs require a prescription from your provider. Having a fever caused by flu infection or other infections early in pregnancy may be linked to birth defects in a baby. In addition to taking antiviral drugs, pregnant women who get a fever should treat their fever with Tylenol (acetaminophen) and contact their provider immediately.  When to [place] If you are pregnant and have any of these signs, seek care immediately:  Difficulty breathing or shortness of breath  Pain or pressure in the chest or abdomen  Sudden dizziness  Confusion  Severe or persistent vomiting  High fever that is not responding to Tylenol (or store brand equivalent)  Decreased or no movement of your baby  [email or web address]

## 2020-01-23 ENCOUNTER — Encounter: Payer: Self-pay | Admitting: Obstetrics and Gynecology

## 2020-01-23 DIAGNOSIS — O26872 Cervical shortening, second trimester: Secondary | ICD-10-CM | POA: Insufficient documentation

## 2020-02-11 ENCOUNTER — Ambulatory Visit (INDEPENDENT_AMBULATORY_CARE_PROVIDER_SITE_OTHER): Payer: 59

## 2020-02-11 ENCOUNTER — Other Ambulatory Visit: Payer: Self-pay | Admitting: Advanced Practice Midwife

## 2020-02-11 ENCOUNTER — Other Ambulatory Visit: Payer: Self-pay | Admitting: Women's Health

## 2020-02-11 ENCOUNTER — Ambulatory Visit (INDEPENDENT_AMBULATORY_CARE_PROVIDER_SITE_OTHER): Payer: 59 | Admitting: Advanced Practice Midwife

## 2020-02-11 VITALS — BP 117/68 | HR 83 | Wt 158.0 lb

## 2020-02-11 DIAGNOSIS — O26872 Cervical shortening, second trimester: Secondary | ICD-10-CM

## 2020-02-11 DIAGNOSIS — Z1389 Encounter for screening for other disorder: Secondary | ICD-10-CM

## 2020-02-11 DIAGNOSIS — O3442 Maternal care for other abnormalities of cervix, second trimester: Secondary | ICD-10-CM

## 2020-02-11 DIAGNOSIS — Z3482 Encounter for supervision of other normal pregnancy, second trimester: Secondary | ICD-10-CM

## 2020-02-11 DIAGNOSIS — Z3A21 21 weeks gestation of pregnancy: Secondary | ICD-10-CM | POA: Diagnosis not present

## 2020-02-11 DIAGNOSIS — Z331 Pregnant state, incidental: Secondary | ICD-10-CM | POA: Diagnosis not present

## 2020-02-11 DIAGNOSIS — Z3480 Encounter for supervision of other normal pregnancy, unspecified trimester: Secondary | ICD-10-CM

## 2020-02-11 LAB — POCT URINALYSIS DIPSTICK OB
Blood, UA: NEGATIVE
Glucose, UA: NEGATIVE
Leukocytes, UA: NEGATIVE
Nitrite, UA: NEGATIVE
POC,PROTEIN,UA: NEGATIVE

## 2020-02-11 MED ORDER — PROGESTERONE 200 MG PO CAPS
200.0000 mg | ORAL_CAPSULE | Freq: Every day | ORAL | 4 refills | Status: DC
Start: 2020-02-11 — End: 2020-03-12

## 2020-02-11 NOTE — Progress Notes (Signed)
   LOW-RISK PREGNANCY VISIT Patient name: Sara Kirby MRN [id number]  Date of birth: 14-Mar-1991 Chief Complaint:   Routine Prenatal Visit ([place] today)  History of Present Illness:   Sara Kirby is a 29 y.o. G51P1001 female at [redacted]w[redacted]d with an Estimated Date of Delivery: 06/18/20 being seen today for ongoing management of a low-risk pregnancy.  Today she reports no complaints. Contractions: Not present. Vag. Bleeding: None.  Movement: Present. denies leaking of fluid. Review of Systems:   Pertinent items are noted in HPI Denies abnormal vaginal discharge w/ itching/odor/irritation, headaches, visual changes, shortness of breath, chest pain, abdominal pain, severe nausea/vomiting, or problems with urination or bowel movements unless otherwise stated above. Pertinent History Reviewed:  Reviewed past medical,surgical, social, obstetrical and family history.  Reviewed problem list, medications and allergies. Physical Assessment:   Vitals:   02/11/20 1534  BP: 117/68  Pulse: 83  Weight: 158 lb (71.7 kg)  Body mass index is 24.75 kg/m.        Physical Examination:   General appearance: Well appearing, and in no distress  Mental status: Alert, oriented to person, place, and time  Skin: Warm & dry  Cardiovascular: Normal heart rate noted  Respiratory: Normal respiratory effort, no distress  Abdomen: Soft, gravid, nontender  Pelvic: Cervical exam deferred         Extremities: Edema: None  Fetal Status:     Movement: Present   [place] TA/TV 21+5 wks,breech, cervical length 2-2.5 cm with and w/o pressure,no funneling,anterior placenta gr 0,normal ovaries,svp of fluid 5.8 cm,fhr 140 bpm  Chaperone: n/a    Results for orders placed or performed in visit on 02/11/20 (from the past 24 hour(s))  POC Urinalysis Dipstick OB   Collection Time: 02/11/20  3:13 PM  Result Value Ref Range   Color, UA     Clarity, UA     Glucose, UA Negative Negative   Bilirubin, UA     Ketones, UA small    Spec Grav,  UA     Blood, UA neg    pH, UA     POC,PROTEIN,UA Negative Negative, Trace, Small (1+), Moderate (2+), Large (3+), 4+   Urobilinogen, UA     Nitrite, UA neg    Leukocytes, UA Negative Negative   Appearance     Odor      Assessment & Plan:  1) Low-risk pregnancy G2P1001 at [redacted]w[redacted]d with an Estimated Date of Delivery: 06/18/20   2) Short cervix, start prometrium   Meds:  Meds ordered this encounter  Medications  . progesterone (PROMETRIUM) 200 MG capsule    Sig: Place 1 capsule (200 mg total) vaginally daily.    Dispense:  30 capsule    Refill:  4    Order Specific Question:   Supervising Provider    Answer:   Florian Buff [[id number]]   Labs/procedures today: cx length  Plan:  Continue routine obstetrical care  Next visit: prefers in person    Reviewed: Preterm labor symptoms and general obstetric precautions including but not limited to vaginal bleeding, contractions, leaking of fluid and fetal movement were reviewed in detail with the patient.  All questions were answered.   Follow-up: Return in about 4 weeks (around 03/10/2020) for cx length [place] and LROB.  Orders Placed This Encounter  Procedures  . POC Urinalysis Dipstick OB   Christin Fudge DNP, CNM 02/11/2020 4:05 PM

## 2020-02-11 NOTE — Progress Notes (Addendum)
[place] TA/TV 21+5 wks,breech, cervical length 2-2.5 cm with and w/o pressure,no funneling,anterior placenta gr 0,normal ovaries,svp of fluid 5.8 cm,fhr 140 bpm

## 2020-02-11 NOTE — Patient Instructions (Signed)
Sara Kirby, I greatly value your feedback.  If you receive a survey following your visit with [place] today, we appreciate you taking the time to fill it out.  Thanks, Nigel Berthold, CNM     [place]!!! It is now [place] at [facility] ([place], [place] [id number]) Entrance located off of [address] parking   Go to [email or web address] to register for FREE online childbirth classes    Second Trimester of Pregnancy The second trimester is from week 14 through week 27 (months 4 through 6). The second trimester is often a time when you feel your best. Your body has adjusted to being pregnant, and you begin to feel better physically. Usually, morning sickness has lessened or quit completely, you may have more energy, and you may have an increase in appetite. The second trimester is also a time when the fetus is growing rapidly. At the end of the sixth month, the fetus is about 9 inches long and weighs about 1 pounds. You will likely begin to feel the baby move (quickening) between 16 and 20 weeks of pregnancy. Body changes during your second trimester Your body continues to go through many changes during your second trimester. The changes vary from woman to woman.  Your weight will continue to increase. You will notice your lower abdomen bulging out.  You may begin to get stretch marks on your hips, abdomen, and breasts.  You may develop headaches that can be relieved by medicines. The medicines should be approved by your health care provider.  You may urinate more often because the fetus is pressing on your bladder.  You may develop or continue to have heartburn as a result of your pregnancy.  You may develop constipation because certain hormones are causing the muscles that push waste through your intestines to slow down.  You may develop hemorrhoids or swollen, bulging veins (varicose veins).  You may have back  pain. This is caused by: ? Weight gain. ? Pregnancy hormones that are relaxing the joints in your pelvis. ? A shift in weight and the muscles that support your balance.  Your breasts will continue to grow and they will continue to become tender.  Your gums may bleed and may be sensitive to brushing and flossing.  Dark spots or blotches (chloasma, mask of pregnancy) may develop on your face. This will likely fade after the baby is born.  A dark line from your belly button to the pubic area (linea nigra) may appear. This will likely fade after the baby is born.  You may have changes in your hair. These can include thickening of your hair, rapid growth, and changes in texture. Some women also have hair loss during or after pregnancy, or hair that feels dry or thin. Your hair will most likely return to normal after your baby is born.  What to expect at prenatal visits During a routine prenatal visit:  You will be weighed to make sure you and the fetus are growing normally.  Your blood pressure will be taken.  Your abdomen will be measured to track your baby's growth.  The fetal heartbeat will be listened to.  Any test results from the previous visit will be discussed.  Your health care provider may ask you:  How you are feeling.  If you are feeling the baby move.  If you have had any abnormal symptoms, such as leaking fluid, bleeding, severe headaches, or  abdominal cramping.  If you are using any tobacco products, including cigarettes, chewing tobacco, and electronic cigarettes.  If you have any questions.  Other tests that may be performed during your second trimester include:  Blood tests that check for: ? Low iron levels (anemia). ? High blood sugar that affects pregnant women (gestational diabetes) between 72 and 28 weeks. ? Rh antibodies. This is to check for a protein on red blood cells (Rh factor).  Urine tests to check for infections, diabetes, or protein in the  urine.  An ultrasound to confirm the proper growth and development of the baby.  An amniocentesis to check for possible genetic problems.  Fetal screens for spina bifida and Down syndrome.  HIV (human immunodeficiency virus) testing. Routine prenatal testing includes screening for HIV, unless you choose not to have this test.  Follow these instructions at home: Medicines  Follow your health care provider's instructions regarding medicine use. Specific medicines may be either safe or unsafe to take during pregnancy.  Take a prenatal vitamin that contains at least 600 micrograms (mcg) of folic acid.  If you develop constipation, try taking a stool softener if your health care provider approves. Eating and drinking  Eat a balanced diet that includes fresh fruits and vegetables, whole grains, good sources of protein such as meat, eggs, or tofu, and low-fat dairy. Your health care provider will help you determine the amount of weight gain that is right for you.  Avoid raw meat and uncooked cheese. These carry germs that can cause birth defects in the baby.  If you have low calcium intake from food, talk to your health care provider about whether you should take a daily calcium supplement.  Limit foods that are high in fat and processed sugars, such as fried and sweet foods.  To prevent constipation: ? Drink enough fluid to keep your urine clear or pale yellow. ? Eat foods that are high in fiber, such as fresh fruits and vegetables, whole grains, and beans. Activity  Exercise only as directed by your health care provider. Most women can continue their usual exercise routine during pregnancy. Try to exercise for 30 minutes at least 5 days a week. Stop exercising if you experience uterine contractions.  Avoid heavy lifting, wear low heel shoes, and practice good posture.  A sexual relationship may be continued unless your health care provider directs you otherwise. Relieving pain and  discomfort  Wear a good support bra to prevent discomfort from breast tenderness.  Take warm sitz baths to soothe any pain or discomfort caused by hemorrhoids. Use hemorrhoid cream if your health care provider approves.  Rest with your legs elevated if you have leg cramps or low back pain.  If you develop varicose veins, wear support hose. Elevate your feet for 15 minutes, 3-4 times a day. Limit salt in your diet. Prenatal Care  Write down your questions. Take them to your prenatal visits.  Keep all your prenatal visits as told by your health care provider. This is important. Safety  Wear your seat belt at all times when driving.  Make a list of emergency phone numbers, including numbers for family, friends, the hospital, and police and fire departments. General instructions  Ask your health care provider for a referral to a local prenatal education class. Begin classes no later than the beginning of month 6 of your pregnancy.  Ask for help if you have counseling or nutritional needs during pregnancy. Your health care provider can offer advice or  refer you to specialists for help with various needs.  Do not use hot tubs, steam rooms, or saunas.  Do not douche or use tampons or scented sanitary pads.  Do not cross your legs for long periods of time.  Avoid cat litter boxes and soil used by cats. These carry germs that can cause birth defects in the baby and possibly loss of the fetus by miscarriage or stillbirth.  Avoid all smoking, herbs, alcohol, and unprescribed drugs. Chemicals in these products can affect the formation and growth of the baby.  Do not use any products that contain nicotine or tobacco, such as cigarettes and e-cigarettes. If you need help quitting, ask your health care provider.  Visit your dentist if you have not gone yet during your pregnancy. Use a soft toothbrush to brush your teeth and be gentle when you floss. Contact a health care provider if:  You  have dizziness.  You have mild pelvic cramps, pelvic pressure, or nagging pain in the abdominal area.  You have persistent nausea, vomiting, or diarrhea.  You have a bad smelling vaginal discharge.  You have pain when you urinate. Get help right away if:  You have a fever.  You are leaking fluid from your vagina.  You have spotting or bleeding from your vagina.  You have severe abdominal cramping or pain.  You have rapid weight gain or weight loss.  You have shortness of breath with chest pain.  You notice sudden or extreme swelling of your face, hands, ankles, feet, or legs.  You have not felt your baby move in over an hour.  You have severe headaches that do not go away when you take medicine.  You have vision changes. Summary  The second trimester is from week 14 through week 27 (months 4 through 6). It is also a time when the fetus is growing rapidly.  Your body goes through many changes during pregnancy. The changes vary from woman to woman.  Avoid all smoking, herbs, alcohol, and unprescribed drugs. These chemicals affect the formation and growth your baby.  Do not use any tobacco products, such as cigarettes, chewing tobacco, and e-cigarettes. If you need help quitting, ask your health care provider.  Contact your health care provider if you have any questions. Keep all prenatal visits as told by your health care provider. This is important. This information is not intended to replace advice given to you by your health care provider. Make sure you discuss any questions you have with your health care provider.

## 2020-03-03 ENCOUNTER — Other Ambulatory Visit: Payer: Self-pay | Admitting: Women's Health

## 2020-03-03 MED ORDER — PANTOPRAZOLE SODIUM 20 MG PO TBEC
20.0000 mg | DELAYED_RELEASE_TABLET | Freq: Every day | ORAL | 6 refills | Status: DC
Start: 2020-03-03 — End: 2020-07-16

## 2020-03-11 ENCOUNTER — Other Ambulatory Visit: Payer: Self-pay | Admitting: Advanced Practice Midwife

## 2020-03-11 DIAGNOSIS — O26879 Cervical shortening, unspecified trimester: Secondary | ICD-10-CM

## 2020-03-11 NOTE — Progress Notes (Signed)
PATIENT ID: Sara Kirby, female     DOB: [birth year], 29 y.o.     MRN: [id number]    LOW-RISK PREGNANCY VISIT PATIENT NAME: Sara Kirby MRN [id number]  DOB: 1990/11/20  Chief Complaint:   No chief complaint on file.   History of Present Illness:   Sara Kirby is a 29 y.o. G40P1001 female at [redacted]w[redacted]d with an Estimated Date of Delivery: 06/18/20 being seen today for ongoing management of a low-risk pregnancy notable for borderline short cervix. She is on Prometrium 200 mg PV hs. No PTL sx.  Depression screen [facility] 2/9 12/05/2019 07/07/2016 06/10/2016  Decreased Interest 0 0 0  Down, Depressed, Hopeless 0 0 0  PHQ - 2 Score 0 0 0  Altered sleeping 0 0 -  Tired, decreased energy 1 1 -  Change in appetite 0 0 -  Feeling bad or failure about yourself  0 0 -  Trouble concentrating 0 0 -  Moving slowly or fidgety/restless 0 0 -  Suicidal thoughts 0 0 -  PHQ-9 Score 1 1 -  Difficult doing work/chores Not difficult at all - -  Some encounter information is confidential and restricted. Go to Review Flowsheets activity to see all data.    Today she reports no complaints.  .  .   . denies leaking of fluid.  Review of Systems:   Pertinent items are noted in HPI Denies abnormal vaginal discharge w/ itching/odor/irritation, headaches, visual changes, shortness of breath, chest pain, abdominal pain, severe nausea/vomiting, or problems with urination or bowel movements unless otherwise stated above.  Pertinent History Reviewed:  Reviewed past medical,surgical, social, obstetrical and family history.  Reviewed problem list, medications and allergies.  Physical Assessment:  There were no vitals filed for this visit.There is no height or weight on file to calculate BMI.  BP 122/73   Pulse 90   Wt 167 lb (75.8 kg)   LMP 09/12/2019   BMI 26.16 kg/m       Physical Examination:   General appearance: Well appearing, and in no distress  Mental status: Alert, oriented to person, place, and time  Skin:  Warm & dry  Cardiovascular: Normal heart rate noted  Respiratory: Normal respiratory effort, no distress  Abdomen: Soft, gravid, nontender   Baby: c/w dates    Pelvic: Cervical exam deferred         Extremities:    Fetal Status:          Chaperone: n/a    No results found for this or any previous visit (from the past 24 hour(s)).  Assessment & Plan:  1) Low-risk pregnancy G2P1001 at [redacted]w[redacted]d with an Estimated Date of Delivery: 06/18/20   2) , short cervix with no change from last visit, now 2.36-2.6 cm length with no funnelling or cervical mucus changes to date.   Meds: No orders of the defined types were placed in this encounter.  Labs/procedures today:  Lab Orders  No laboratory test(s) ordered today   TECHNICIAN COMMENTS: Ob u/s  [place] 26 wks,cephalic,anterior placenta gr 0,afi 15 cm,fhr 154 bpm,cervical length 2.3-2.6 with and w/o pressure,NO funneling visualized         A copy of this report including all images has been saved and backed up to a second source for retrieval if needed. All measures and details of the anatomical scan, placentation, fluid volume and pelvic anatomy are contained in that report.  Silver Huguenin 03/12/2020 11:36 AM   Plan:  Continue routine obstetrical care  pn2 in 2 wk. Next visit: prefers in person    Reviewed: Preterm labor symptoms and general obstetric precautions including but not limited to vaginal bleeding, contractions, leaking of fluid and fetal movement were reviewed in detail with the patient.  All questions were answered. Check bp weekly, let us know if >140/90.   Follow-up: No follow-ups on file.   By signing my name below, I, [employer], attest that this documentation has been prepared under the direction and in the presence of Jonnie Kind, MD. Electronically Signed: [place]. 03/11/20. 11:18 PM.  I personally performed the services described in this documentation, which was SCRIBED in my  presence. The recorded information has been reviewed and considered accurate. It has been edited as necessary during review. Jonnie Kind, MD

## 2020-03-12 ENCOUNTER — Ambulatory Visit (INDEPENDENT_AMBULATORY_CARE_PROVIDER_SITE_OTHER): Payer: 59 | Admitting: Obstetrics and Gynecology

## 2020-03-12 ENCOUNTER — Ambulatory Visit (INDEPENDENT_AMBULATORY_CARE_PROVIDER_SITE_OTHER): Payer: 59

## 2020-03-12 ENCOUNTER — Encounter: Payer: Self-pay | Admitting: Obstetrics and Gynecology

## 2020-03-12 ENCOUNTER — Other Ambulatory Visit: Payer: Self-pay

## 2020-03-12 VITALS — BP 122/73 | HR 90 | Wt 167.0 lb

## 2020-03-12 DIAGNOSIS — Z1389 Encounter for screening for other disorder: Secondary | ICD-10-CM

## 2020-03-12 DIAGNOSIS — O26879 Cervical shortening, unspecified trimester: Secondary | ICD-10-CM | POA: Diagnosis not present

## 2020-03-12 DIAGNOSIS — Z3480 Encounter for supervision of other normal pregnancy, unspecified trimester: Secondary | ICD-10-CM

## 2020-03-12 DIAGNOSIS — Z3A26 26 weeks gestation of pregnancy: Secondary | ICD-10-CM | POA: Diagnosis not present

## 2020-03-12 DIAGNOSIS — Z331 Pregnant state, incidental: Secondary | ICD-10-CM

## 2020-03-12 LAB — POCT URINALYSIS DIPSTICK OB
Blood, UA: NEGATIVE
Glucose, UA: NEGATIVE
Ketones, UA: NEGATIVE
Nitrite, UA: NEGATIVE
POC,PROTEIN,UA: NEGATIVE

## 2020-03-12 NOTE — Progress Notes (Signed)
[place] 26 wks,cephalic,anterior placenta gr 0,afi 15 cm,fhr 154 bpm,cervical length 2.3-2.6 with and w/o pressure,NO funneling visualized

## 2020-03-26 ENCOUNTER — Other Ambulatory Visit: Payer: 59

## 2020-03-26 ENCOUNTER — Ambulatory Visit (INDEPENDENT_AMBULATORY_CARE_PROVIDER_SITE_OTHER): Payer: 59 | Admitting: Advanced Practice Midwife

## 2020-03-26 VITALS — BP 119/72 | HR 93 | Wt 168.0 lb

## 2020-03-26 DIAGNOSIS — Z3A28 28 weeks gestation of pregnancy: Secondary | ICD-10-CM

## 2020-03-26 DIAGNOSIS — Z23 Encounter for immunization: Secondary | ICD-10-CM

## 2020-03-26 DIAGNOSIS — Z331 Pregnant state, incidental: Secondary | ICD-10-CM | POA: Diagnosis not present

## 2020-03-26 DIAGNOSIS — Z1389 Encounter for screening for other disorder: Secondary | ICD-10-CM

## 2020-03-26 DIAGNOSIS — Z3483 Encounter for supervision of other normal pregnancy, third trimester: Secondary | ICD-10-CM | POA: Diagnosis not present

## 2020-03-26 DIAGNOSIS — O26872 Cervical shortening, second trimester: Secondary | ICD-10-CM

## 2020-03-26 DIAGNOSIS — Z3480 Encounter for supervision of other normal pregnancy, unspecified trimester: Secondary | ICD-10-CM

## 2020-03-26 LAB — POCT URINALYSIS DIPSTICK OB
Blood, UA: NEGATIVE
Glucose, UA: NEGATIVE
Ketones, UA: NEGATIVE
Leukocytes, UA: NEGATIVE
Nitrite, UA: NEGATIVE
POC,PROTEIN,UA: NEGATIVE

## 2020-03-26 NOTE — Patient Instructions (Signed)
Sara Kirby, I greatly value your feedback.  If you receive a survey following your visit with us today, we appreciate you taking the time to fill it out.  Thanks, [name], CNM   [facility] ([address] [place], [place] [address]) Entrance C, located off of [address] Free 24/7 valet parking  Go to [email or web address] to register for FREE online childbirth classes   Call the office [phone]) or go to [facility] if:  You begin to have strong, frequent contractions  Your water breaks.  Sometimes it is a big gush of fluid, sometimes it is just a trickle that keeps getting your panties wet or running down your legs  You have vaginal bleeding.  It is normal to have a small amount of spotting if your cervix was checked.   You don't feel your baby moving like normal.  If you don't, get you something to eat and drink and lay down and focus on feeling your baby move.  You should feel at least 10 movements in 2 hours.  If you don't, you should call the office or go to [facility].    Tdap Vaccine  It is recommended that you get the Tdap vaccine during the third trimester of EACH pregnancy to help protect your baby from getting pertussis (whooping cough)  27-36 weeks is the BEST time to do this so that you can pass the protection on to your baby. During pregnancy is better than after pregnancy, but if you are unable to get it during pregnancy it will be offered at the hospital.   You can get this vaccine with us, at the health department, your family doctor, or some local pharmacies  Everyone who will be around your baby should also be up-to-date on their vaccines before the baby comes. Adults (who are not pregnant) only need 1 dose of Tdap during adulthood.   [place] Pediatricians/Family Doctors:  [facility] [phone]            [facility] [phone]                  [facility] [phone]  (usually not accepting new patients unless you have family there already, you are always welcome to call and ask)       [facility] [phone]       [place] Pediatricians/Family Doctors:   [facility]: [phone]  [facility]: [phone]  [facility]: [phone]  [place] Family Doctors:   [facility]: [phone]   [facility]: [phone]  [place] Family Doctors:  [facility]: [phone]   Home Blood Pressure Monitoring for Patients   Your provider has recommended that you check your blood pressure (BP) at least once a week at home. If you do not have a blood pressure cuff at home, one will be provided for you. Contact your provider if you have not received your monitor within 1 week.   Helpful Tips for Accurate Home Blood Pressure Checks  . Don't smoke, exercise, or drink caffeine 30 minutes before checking your BP . Use the restroom before checking your BP (a full bladder can raise your pressure) . Relax in a comfortable upright chair . Feet on the ground . Left arm resting comfortably on a flat surface at the level of your heart . Legs uncrossed . Back supported . Sit quietly and don't talk . Place the cuff on your bare   arm . Adjust snuggly, so that only two fingertips can fit between your skin and the top of the cuff . Check 2 readings separated by at least one minute . Keep a log of your BP readings . For a visual, please reference this diagram: http://ccnc.care/bpdiagram  Provider Name: [facility]     Phone: [phone]  Zone 1: ALL CLEAR  Continue to monitor your symptoms:  . BP reading is less than 140 (top number) or less than 90 (bottom number)  . No right upper stomach pain . No headaches or seeing spots . No feeling nauseated or throwing up . No swelling in face and hands  Zone 2: CAUTION Call your doctor's office for  any of the following:  . BP reading is greater than 140 (top number) or greater than 90 (bottom number)  . Stomach pain under your ribs in the middle or right side . Headaches or seeing spots . Feeling nauseated or throwing up . Swelling in face and hands  Zone 3: EMERGENCY  Seek immediate medical care if you have any of the following:  . BP reading is greater than160 (top number) or greater than 110 (bottom number) . Severe headaches not improving with Tylenol . Serious difficulty catching your breath . Any worsening symptoms from Zone 2   Third Trimester of Pregnancy The third trimester is from week 29 through week 42, months 7 through 9. The third trimester is a time when the fetus is growing rapidly. At the end of the ninth month, the fetus is about 20 inches in length and weighs 6-10 pounds.  BODY CHANGES Your body goes through many changes during pregnancy. The changes vary from woman to woman.   Your weight will continue to increase. You can expect to gain 25-35 pounds (11-16 kg) by the end of the pregnancy.  You may begin to get stretch marks on your hips, abdomen, and breasts.  You may urinate more often because the fetus is moving lower into your pelvis and pressing on your bladder.  You may develop or continue to have heartburn as a result of your pregnancy.  You may develop constipation because certain hormones are causing the muscles that push waste through your intestines to slow down.  You may develop hemorrhoids or swollen, bulging veins (varicose veins).  You may have pelvic pain because of the weight gain and pregnancy hormones relaxing your joints between the bones in your pelvis. Backaches may result from overexertion of the muscles supporting your posture.  You may have changes in your hair. These can include thickening of your hair, rapid growth, and changes in texture. Some women also have hair loss during or after pregnancy, or hair that feels dry or thin.  Your hair will most likely return to normal after your baby is born.  Your breasts will continue to grow and be tender. A yellow discharge may leak from your breasts called colostrum.  Your belly button may stick out.  You may feel short of breath because of your expanding uterus.  You may notice the fetus "dropping," or moving lower in your abdomen.  You may have a bloody mucus discharge. This usually occurs a few days to a week before labor begins.  Your cervix becomes thin and soft (effaced) near your due date. WHAT TO EXPECT AT YOUR PRENATAL EXAMS  You will have prenatal exams every 2 weeks until week 36. Then, you will have weekly prenatal exams. During a routine prenatal visit:  You  will be weighed to make sure you and the fetus are growing normally.  Your blood pressure is taken.  Your abdomen will be measured to track your baby's growth.  The fetal heartbeat will be listened to.  Any test results from the previous visit will be discussed.  You may have a cervical check near your due date to see if you have effaced. At around 36 weeks, your caregiver will check your cervix. At the same time, your caregiver will also perform a test on the secretions of the vaginal tissue. This test is to determine if a type of bacteria, Group B streptococcus, is present. Your caregiver will explain this further. Your caregiver may ask you:  What your birth plan is.  How you are feeling.  If you are feeling the baby move.  If you have had any abnormal symptoms, such as leaking fluid, bleeding, severe headaches, or abdominal cramping.  If you have any questions. Other tests or screenings that may be performed during your third trimester include:  Blood tests that check for low iron levels (anemia).  Fetal testing to check the health, activity level, and growth of the fetus. Testing is done if you have certain medical conditions or if there are problems during the pregnancy. FALSE  LABOR You may feel small, irregular contractions that eventually go away. These are called Braxton Hicks contractions, or false labor. Contractions may last for hours, days, or even weeks before true labor sets in. If contractions come at regular intervals, intensify, or become painful, it is best to be seen by your caregiver.  SIGNS OF LABOR   Menstrual-like cramps.  Contractions that are 5 minutes apart or less.  Contractions that start on the top of the uterus and spread down to the lower abdomen and back.  A sense of increased pelvic pressure or back pain.  A watery or bloody mucus discharge that comes from the vagina. If you have any of these signs before the 37th week of pregnancy, call your caregiver right away. You need to go to the hospital to get checked immediately. HOME CARE INSTRUCTIONS   Avoid all smoking, herbs, alcohol, and unprescribed drugs. These chemicals affect the formation and growth of the baby.  Follow your caregiver's instructions regarding medicine use. There are medicines that are either safe or unsafe to take during pregnancy.  Exercise only as directed by your caregiver. Experiencing uterine cramps is a good sign to stop exercising.  Continue to eat regular, healthy meals.  Wear a good support bra for breast tenderness.  Do not use hot tubs, steam rooms, or saunas.  Wear your seat belt at all times when driving.  Avoid raw meat, uncooked cheese, cat litter boxes, and soil used by cats. These carry germs that can cause birth defects in the baby.  Take your prenatal vitamins.  Try taking a stool softener (if your caregiver approves) if you develop constipation. Eat more high-fiber foods, such as fresh vegetables or fruit and whole grains. Drink plenty of fluids to keep your urine clear or pale yellow.  Take warm sitz baths to soothe any pain or discomfort caused by hemorrhoids. Use hemorrhoid cream if your caregiver approves.  If you develop varicose  veins, wear support hose. Elevate your feet for 15 minutes, 3-4 times a day. Limit salt in your diet.  Avoid heavy lifting, wear low heal shoes, and practice good posture.  Rest a lot with your legs elevated if you have leg cramps or low back  pain.  Visit your dentist if you have not gone during your pregnancy. Use a soft toothbrush to brush your teeth and be gentle when you floss.  A sexual relationship may be continued unless your caregiver directs you otherwise.  Do not travel far distances unless it is absolutely necessary and only with the approval of your caregiver.  Take prenatal classes to understand, practice, and ask questions about the labor and delivery.  Make a trial run to the hospital.  Pack your hospital bag.  Prepare the baby's nursery.  Continue to go to all your prenatal visits as directed by your caregiver. SEEK MEDICAL CARE IF:  You are unsure if you are in labor or if your water has broken.  You have dizziness.  You have mild pelvic cramps, pelvic pressure, or nagging pain in your abdominal area.  You have persistent nausea, vomiting, or diarrhea.  You have a bad smelling vaginal discharge.  You have pain with urination. SEEK IMMEDIATE MEDICAL CARE IF:   You have a fever.  You are leaking fluid from your vagina.  You have spotting or bleeding from your vagina.  You have severe abdominal cramping or pain.  You have rapid weight loss or gain.  You have shortness of breath with chest pain.  You notice sudden or extreme swelling of your face, hands, ankles, feet, or legs.  You have not felt your baby move in over an hour.  You have severe headaches that do not go away with medicine.  You have vision changes. Document Released: 06/15/2001 Document Revised: 06/26/2013 Document Reviewed: 08/22/2012 [employer] Patient Information 2015 [place], [place]. This information is not intended to replace advice given to you by your health care provider. Make  sure you discuss any questions you have with your health care provider.

## 2020-03-26 NOTE — Progress Notes (Signed)
   LOW-RISK PREGNANCY VISIT Patient name: Sara Kirby MRN [id number]  Date of birth: 09-12-1990 Chief Complaint:   Routine Prenatal Visit  History of Present Illness:   Sara Kirby is a 28 y.o. G23P1001 female at [redacted]w[redacted]d with an Estimated Date of Delivery: 06/18/20 being seen today for ongoing management of a low-risk pregnancy.  Today she reports doing well; still using Prometrium nightly due to short cx. Contractions: Not present. Vag. Bleeding: None.  Movement: Present. denies leaking of fluid. Review of Systems:   Pertinent items are noted in HPI Denies abnormal vaginal discharge w/ itching/odor/irritation, headaches, visual changes, shortness of breath, chest pain, abdominal pain, severe nausea/vomiting, or problems with urination or bowel movements unless otherwise stated above. Pertinent History Reviewed:  Reviewed past medical,surgical, social, obstetrical and family history.  Reviewed problem list, medications and allergies. Physical Assessment:   Vitals:   03/26/20 0906  BP: 119/72  Pulse: 93  Weight: 168 lb (76.2 kg)  Body mass index is 26.31 kg/m.        Physical Examination:   General appearance: Well appearing, and in no distress  Mental status: Alert, oriented to person, place, and time  Skin: Warm & dry  Cardiovascular: Normal heart rate noted  Respiratory: Normal respiratory effort, no distress  Abdomen: Soft, gravid, nontender  Pelvic: Cervical exam deferred         Extremities: Edema: None  Fetal Status: Fetal Heart Rate (bpm): 148 Fundal Height: 27 cm Movement: Present    Results for orders placed or performed in visit on 03/26/20 (from the past 24 hour(s))  POC Urinalysis Dipstick OB   Collection Time: 03/26/20  9:08 AM  Result Value Ref Range   Color, UA     Clarity, UA     Glucose, UA Negative Negative   Bilirubin, UA     Ketones, UA neg    Spec Grav, UA     Blood, UA neg    pH, UA     POC,PROTEIN,UA Negative Negative, Trace, Small (1+), Moderate  (2+), Large (3+), 4+   Urobilinogen, UA     Nitrite, UA neg    Leukocytes, UA Negative Negative   Appearance     Odor      Assessment & Plan:  1) Low-risk pregnancy G2P1001 at [redacted]w[redacted]d with an Estimated Date of Delivery: 06/18/20   2) Short cx, using Prometrium qhs until 36wks   Meds: No orders of the defined types were placed in this encounter.  Labs/procedures today: PN2, Tdap  Plan:  Continue routine obstetrical care   Reviewed: Preterm labor symptoms and general obstetric precautions including but not limited to vaginal bleeding, contractions, leaking of fluid and fetal movement were reviewed in detail with the patient.  All questions were answered. Has home bp cuff. Check bp weekly, let us know if >140/90.   Follow-up: Return in about 2 weeks (around 04/09/2020) for [place], in person.  Orders Placed This Encounter  Procedures  . Tdap vaccine greater than or equal to 7yo IM  . POC Urinalysis Dipstick OB   [facility] 03/26/2020 9:31 AM

## 2020-03-27 LAB — CBC
Hematocrit: 35.7 % (ref 34.0–46.6)
Hemoglobin: 12.3 g/dL (ref 11.1–15.9)
MCH: 32.5 pg (ref 26.6–33.0)
MCHC: 34.5 g/dL (ref 31.5–35.7)
MCV: 94 fL (ref 79–97)
Platelets: 229 10*3/uL (ref 150–450)
RBC: 3.79 x10E6/uL (ref 3.77–5.28)
RDW: 12.3 % (ref 11.7–15.4)
WBC: 9.7 10*3/uL (ref 3.4–10.8)

## 2020-03-27 LAB — HIV ANTIBODY (ROUTINE TESTING W REFLEX): HIV Screen 4th Generation wRfx: NONREACTIVE

## 2020-03-27 LAB — GLUCOSE TOLERANCE, 2 HOURS W/ 1HR
Glucose, 1 hour: 90 mg/dL (ref 65–179)
Glucose, 2 hour: 83 mg/dL (ref 65–152)
Glucose, Fasting: 80 mg/dL (ref 65–91)

## 2020-03-27 LAB — ANTIBODY SCREEN: Antibody Screen: NEGATIVE

## 2020-03-27 LAB — RPR: RPR Ser Ql: NONREACTIVE

## 2020-04-08 ENCOUNTER — Encounter: Payer: Self-pay | Admitting: Women's Health

## 2020-04-08 ENCOUNTER — Ambulatory Visit (INDEPENDENT_AMBULATORY_CARE_PROVIDER_SITE_OTHER): Payer: 59 | Admitting: Women's Health

## 2020-04-08 VITALS — BP 124/72 | HR 87 | Wt 174.2 lb

## 2020-04-08 DIAGNOSIS — Z23 Encounter for immunization: Secondary | ICD-10-CM | POA: Diagnosis not present

## 2020-04-08 DIAGNOSIS — Z331 Pregnant state, incidental: Secondary | ICD-10-CM

## 2020-04-08 DIAGNOSIS — Z3480 Encounter for supervision of other normal pregnancy, unspecified trimester: Secondary | ICD-10-CM

## 2020-04-08 DIAGNOSIS — Z3A29 29 weeks gestation of pregnancy: Secondary | ICD-10-CM

## 2020-04-08 DIAGNOSIS — Z1389 Encounter for screening for other disorder: Secondary | ICD-10-CM

## 2020-04-08 DIAGNOSIS — Z3483 Encounter for supervision of other normal pregnancy, third trimester: Secondary | ICD-10-CM

## 2020-04-08 LAB — POCT URINALYSIS DIPSTICK OB
Blood, UA: NEGATIVE
Glucose, UA: NEGATIVE
Ketones, UA: NEGATIVE
Leukocytes, UA: NEGATIVE
Nitrite, UA: NEGATIVE

## 2020-04-08 NOTE — Patient Instructions (Signed)
Marelly M Kamps, I greatly value your feedback.  If you receive a survey following your visit with [place] today, we appreciate you taking the time to fill it out.  Thanks, Knute Neu, CNM, WHNP-BC  [facility] ([place], [place] [address]) Entrance C, located off of [place] parking   Go to [email or web address] to register for FREE online childbirth classes    Call the office [phone]) or go to [facility] if:  You begin to have strong, frequent contractions  Your water breaks.  Sometimes it is a big gush of fluid, sometimes it is just a trickle that keeps getting your panties wet or running down your legs  You have vaginal bleeding.  It is normal to have a small amount of spotting if your cervix was checked.   You don't feel your baby moving like normal.  If you don't, get you something to eat and drink and lay down and focus on feeling your baby move.  You should feel at least 10 movements in 2 hours.  If you don't, you should call the office or go to [facility].   Call the office [phone]) or go to [facility] hospital for these signs of pre-eclampsia:  Severe headache that does not go away with Tylenol  Visual changes- seeing spots, double, blurred vision  Pain under your right breast or upper abdomen that does not go away with Tums or heartburn medicine  Nausea and/or vomiting  Severe swelling in your hands, feet, and face    Home Blood Pressure Monitoring for Patients   Your provider has recommended that you check your blood pressure (BP) at least once a week at home. If you do not have a blood pressure cuff at home, one will be provided for you. Contact your provider if you have not received your monitor within 1 week.   Helpful Tips for Accurate Home Blood Pressure Checks  . Don't smoke, exercise, or drink caffeine 30 minutes before checking your BP . Use the restroom before checking your BP (a full bladder  can raise your pressure) . Relax in a comfortable upright chair . Feet on the ground . Left arm resting comfortably on a flat surface at the level of your heart . Legs uncrossed . Back supported . Sit quietly and don't talk . Place the cuff on your bare arm . Adjust snuggly, so that only two fingertips can fit between your skin and the top of the cuff . Check 2 readings separated by at least one minute . Keep a log of your BP readings . For a visual, please reference this diagram: http://ccnc.care/bpdiagram  Provider Name: [facility]     Phone: [phone]  Zone 1: ALL CLEAR  Continue to monitor your symptoms:  . BP reading is less than 140 (top number) or less than 90 (bottom number)  . No right upper stomach pain . No headaches or seeing spots . No feeling nauseated or throwing up . No swelling in face and hands  Zone 2: CAUTION Call your doctor's office for any of the following:  . BP reading is greater than 140 (top number) or greater than 90 (bottom number)  . Stomach pain under your ribs in the middle or right side . Headaches or seeing spots . Feeling nauseated or throwing up . Swelling in face and hands  Zone 3: EMERGENCY  Seek immediate medical care if you have any of  the following:  . BP reading is greater than160 (top number) or greater than 110 (bottom number) . Severe headaches not improving with Tylenol . Serious difficulty catching your breath . Any worsening symptoms from Zone 2  Preterm Labor and Birth Information  The normal length of a pregnancy is 39-41 weeks. Preterm labor is when labor starts before 37 completed weeks of pregnancy. What are the risk factors for preterm labor? Preterm labor is more likely to occur in women who:  Have certain infections during pregnancy such as a bladder infection, sexually transmitted infection, or infection inside the uterus (chorioamnionitis).  Have a shorter-than-normal cervix.  Have gone into preterm  labor before.  Have had surgery on their cervix.  Are younger than age 24 or older than age 36.  Are African American.  Are pregnant with twins or multiple babies (multiple gestation).  Take street drugs or smoke while pregnant.  Do not gain enough weight while pregnant.  Became pregnant shortly after having been pregnant. What are the symptoms of preterm labor? Symptoms of preterm labor include:  Cramps similar to those that can happen during a menstrual period. The cramps may happen with diarrhea.  Pain in the abdomen or lower back.  Regular uterine contractions that may feel like tightening of the abdomen.  A feeling of increased pressure in the pelvis.  Increased watery or bloody mucus discharge from the vagina.  Water breaking (ruptured amniotic sac). Why is it important to recognize signs of preterm labor? It is important to recognize signs of preterm labor because babies who are born prematurely may not be fully developed. This can put them at an increased risk for:  Long-term (chronic) heart and lung problems.  Difficulty immediately after birth with regulating body systems, including blood sugar, body temperature, heart rate, and breathing rate.  Bleeding in the brain.  Cerebral palsy.  Learning difficulties.  Death. These risks are highest for babies who are born before 43 weeks of pregnancy. How is preterm labor treated? Treatment depends on the length of your pregnancy, your condition, and the health of your baby. It may involve: 1. Having a stitch (suture) placed in your cervix to prevent your cervix from opening too early (cerclage). 2. Taking or being given medicines, such as: ? Hormone medicines. These may be given early in pregnancy to help support the pregnancy. ? Medicine to stop contractions. ? Medicines to help mature the baby's lungs. These may be prescribed if the risk of delivery is high. ? Medicines to prevent your baby from developing  cerebral palsy. If the labor happens before 34 weeks of pregnancy, you may need to stay in the hospital. What should I do if I think I am in preterm labor? If you think that you are going into preterm labor, call your health care provider right away. How can I prevent preterm labor in future pregnancies? To increase your chance of having a full-term pregnancy:  Do not use any tobacco products, such as cigarettes, chewing tobacco, and e-cigarettes. If you need help quitting, ask your health care provider.  Do not use street drugs or medicines that have not been prescribed to you during your pregnancy.  Talk with your health care provider before taking any herbal supplements, even if you have been taking them regularly.  Make sure you gain a healthy amount of weight during your pregnancy.  Watch for infection. If you think that you might have an infection, get it checked right away.  Make sure to  tell your health care provider if you have gone into preterm labor before. This information is not intended to replace advice given to you by your health care provider. Make sure you discuss any questions you have with your health care provider. Document Revised: 10/13/2018 Document Reviewed: 11/12/2015 Elsevier Patient Education  [place].

## 2020-04-08 NOTE — Progress Notes (Signed)
LOW-RISK PREGNANCY VISIT Patient name: Sara Kirby MRN [id number]  Date of birth: Jun 15, 1991 Chief Complaint:   Routine Prenatal Visit  History of Present Illness:   Sara Kirby is a 29 y.o. G49P1001 female at [redacted]w[redacted]d with an Estimated Date of Delivery: 06/18/20 being seen today for ongoing management of a low-risk pregnancy.  Depression screen [facility] 2/9 03/26/2020 12/05/2019 07/07/2016 06/10/2016  Decreased Interest 0 0 0 0  Down, Depressed, Hopeless 0 0 0 0  PHQ - 2 Score 0 0 0 0  Altered sleeping 0 0 0 -  Tired, decreased energy 2 1 1  -  Change in appetite 0 0 0 -  Feeling bad or failure about yourself  0 0 0 -  Trouble concentrating 0 0 0 -  Moving slowly or fidgety/restless 0 0 0 -  Suicidal thoughts 0 0 0 -  PHQ-9 Score 2 1 1  -  Difficult doing work/chores - Not difficult at all - -  Some encounter information is confidential and restricted. Go to Review Flowsheets activity to see all data.    Today she reports some abdominal muscle strains when lifting her other child. Contractions: Not present. Vag. Bleeding: None.  Movement: Present. denies leaking of fluid. Review of Systems:   Pertinent items are noted in HPI Denies abnormal vaginal discharge w/ itching/odor/irritation, headaches, visual changes, shortness of breath, chest pain, abdominal pain, severe nausea/vomiting, or problems with urination or bowel movements unless otherwise stated above. Pertinent History Reviewed:  Reviewed past medical,surgical, social, obstetrical and family history.  Reviewed problem list, medications and allergies. Physical Assessment:   Vitals:   04/08/20 1438  BP: 124/72  Pulse: 87  Weight: 174 lb 3.2 oz (79 kg)  Body mass index is 27.28 kg/m.        Physical Examination:   General appearance: Well appearing, and in no distress  Mental status: Alert, oriented to person, place, and time  Skin: Warm & dry  Cardiovascular: Normal heart rate noted  Respiratory: Normal respiratory effort, no  distress  Abdomen: Soft, gravid, nontender  Pelvic: Cervical exam deferred         Extremities: Edema: Trace  Fetal Status: Fetal Heart Rate (bpm): 143 Fundal Height: 28 cm Movement: Present    Chaperone: n/a    Results for orders placed or performed in visit on 04/08/20 (from the past 24 hour(s))  POC Urinalysis Dipstick OB   Collection Time: 04/08/20  2:39 PM  Result Value Ref Range   Color, UA     Clarity, UA     Glucose, UA Negative Negative   Bilirubin, UA     Ketones, UA neg    Spec Grav, UA     Blood, UA neg    pH, UA     POC,PROTEIN,UA Trace Negative, Trace, Small (1+), Moderate (2+), Large (3+), 4+   Urobilinogen, UA     Nitrite, UA neg    Leukocytes, UA Negative Negative   Appearance     Odor      Assessment & Plan:  1) Low-risk pregnancy G2P1001 at [redacted]w[redacted]d with an Estimated Date of Delivery: 06/18/20   2) Borderline short cx, last 2.3-2.6cm @ 26wks, on prometrium   Meds: No orders of the defined types were placed in this encounter.  Labs/procedures today: flu shot  Plan:  Continue routine obstetrical care  Next visit: prefers in person    Reviewed: Preterm labor symptoms and general obstetric precautions including but not limited to vaginal bleeding, contractions, leaking of  fluid and fetal movement were reviewed in detail with the patient.  All questions were answered. Has home bp cuff.  Check bp weekly, let us know if >140/90.   Follow-up: Return in about 2 weeks (around 04/22/2020) for [place], [place], in person.  Orders Placed This Encounter  Procedures  . Flu Vaccine QUAD 36+ mos IM  . POC Urinalysis Dipstick OB   Roma Schanz CNM, [facility] 04/08/2020 2:52 PM

## 2020-04-16 MED FILL — PROGESTERONE MICRONIZED 200: 200 | 30 days supply | Qty: 30 | Fill #0

## 2020-04-16 MED FILL — PANTOPRAZOLE SOD DR 20 MG T: 20 | 30 days supply | Qty: 30 | Fill #0

## 2020-04-23 ENCOUNTER — Ambulatory Visit (INDEPENDENT_AMBULATORY_CARE_PROVIDER_SITE_OTHER): Payer: 59 | Admitting: Advanced Practice Midwife

## 2020-04-23 ENCOUNTER — Encounter: Payer: Self-pay | Admitting: Advanced Practice Midwife

## 2020-04-23 VITALS — BP 127/73 | HR 95 | Wt 177.0 lb

## 2020-04-23 DIAGNOSIS — Z3A32 32 weeks gestation of pregnancy: Secondary | ICD-10-CM

## 2020-04-23 DIAGNOSIS — Z331 Pregnant state, incidental: Secondary | ICD-10-CM

## 2020-04-23 DIAGNOSIS — Z20828 Contact with and (suspected) exposure to other viral communicable diseases: Secondary | ICD-10-CM | POA: Diagnosis not present

## 2020-04-23 DIAGNOSIS — Z3480 Encounter for supervision of other normal pregnancy, unspecified trimester: Secondary | ICD-10-CM

## 2020-04-23 DIAGNOSIS — Z1389 Encounter for screening for other disorder: Secondary | ICD-10-CM

## 2020-04-23 LAB — POCT URINALYSIS DIPSTICK OB
Blood, UA: NEGATIVE
Glucose, UA: NEGATIVE
Ketones, UA: NEGATIVE
Leukocytes, UA: NEGATIVE
Nitrite, UA: NEGATIVE
POC,PROTEIN,UA: NEGATIVE

## 2020-04-23 NOTE — Progress Notes (Signed)
   LOW-RISK PREGNANCY VISIT Patient name: Sara Kirby MRN [id number]  Date of birth: 1991/03/27 Chief Complaint:   Routine Prenatal Visit  History of Present Illness:   Sara Kirby is a 29 y.o. G19P1001 female at [redacted]w[redacted]d with an Estimated Date of Delivery: 06/18/20 being seen today for ongoing management of a low-risk pregnancy.  Today she reports doing well. Contractions: Not present. Vag. Bleeding: None.  Movement: Present. denies leaking of fluid. Review of Systems:   Pertinent items are noted in HPI Denies abnormal vaginal discharge w/ itching/odor/irritation, headaches, visual changes, shortness of breath, chest pain, abdominal pain, severe nausea/vomiting, or problems with urination or bowel movements unless otherwise stated above. Pertinent History Reviewed:  Reviewed past medical,surgical, social, obstetrical and family history.  Reviewed problem list, medications and allergies. Physical Assessment:   Vitals:   04/23/20 1051  BP: 127/73  Pulse: 95  Weight: 177 lb (80.3 kg)  Body mass index is 27.72 kg/m.        Physical Examination:   General appearance: Well appearing, and in no distress  Mental status: Alert, oriented to person, place, and time  Skin: Warm & dry  Cardiovascular: Normal heart rate noted  Respiratory: Normal respiratory effort, no distress  Abdomen: Soft, gravid, nontender  Pelvic: Cervical exam deferred         Extremities: Edema: None  Fetal Status: Fetal Heart Rate (bpm): 132 Fundal Height: 32 cm Movement: Present    Results for orders placed or performed in visit on 04/23/20 (from the past 24 hour(s))  POC Urinalysis Dipstick OB   Collection Time: 04/23/20 10:53 AM  Result Value Ref Range   Color, UA     Clarity, UA     Glucose, UA Negative Negative   Bilirubin, UA     Ketones, UA neg    Spec Grav, UA     Blood, UA neg    pH, UA     POC,PROTEIN,UA Negative Negative, Trace, Small (1+), Moderate (2+), Large (3+), 4+   Urobilinogen, UA      Nitrite, UA neg    Leukocytes, UA Negative Negative   Appearance     Odor      Assessment & Plan:  1) Low-risk pregnancy G2P1001 at [redacted]w[redacted]d with an Estimated Date of Delivery: 06/18/20   2) Borderline short cx, taking vag Prometrium until 36wks; no s/s PTL   Meds: No orders of the defined types were placed in this encounter.  Labs/procedures today: none  Plan:  Continue routine obstetrical care   Reviewed: Preterm labor symptoms and general obstetric precautions including but not limited to vaginal bleeding, contractions, leaking of fluid and fetal movement were reviewed in detail with the patient.  All questions were answered. Has home bp cuff. Check bp weekly, let us know if >140/90.   Follow-up: Return in about 2 weeks (around 05/07/2020) for [place], in person.  Orders Placed This Encounter  Procedures  . POC Urinalysis Dipstick OB   Sara Kirby 04/23/2020 11:04 AM

## 2020-04-28 ENCOUNTER — Encounter: Payer: Self-pay | Admitting: *Deleted

## 2020-04-28 DIAGNOSIS — Z029 Encounter for administrative examinations, unspecified: Secondary | ICD-10-CM

## 2020-05-07 ENCOUNTER — Ambulatory Visit (INDEPENDENT_AMBULATORY_CARE_PROVIDER_SITE_OTHER): Payer: 59 | Admitting: Advanced Practice Midwife

## 2020-05-07 VITALS — BP 129/74 | HR 100 | Wt 179.6 lb

## 2020-05-07 DIAGNOSIS — Z331 Pregnant state, incidental: Secondary | ICD-10-CM

## 2020-05-07 DIAGNOSIS — Z1389 Encounter for screening for other disorder: Secondary | ICD-10-CM

## 2020-05-07 DIAGNOSIS — Z3A34 34 weeks gestation of pregnancy: Secondary | ICD-10-CM

## 2020-05-07 DIAGNOSIS — Z3480 Encounter for supervision of other normal pregnancy, unspecified trimester: Secondary | ICD-10-CM

## 2020-05-07 LAB — POCT URINALYSIS DIPSTICK OB
Blood, UA: NEGATIVE
Glucose, UA: NEGATIVE
Leukocytes, UA: NEGATIVE
Nitrite, UA: NEGATIVE
POC,PROTEIN,UA: NEGATIVE

## 2020-05-07 NOTE — Patient Instructions (Signed)
Sara Kirby, I greatly value your feedback.  If you receive a survey following your visit with [place] today, we appreciate you taking the time to fill it out.  Thanks, Derrill Memo, [place] at [facility] ([place], [place] [address]) Entrance C, located off of [address] parking  Go to [email or web address] to register for FREE online childbirth classes   Call the office [phone]) or go to [facility] if:  You begin to have strong, frequent contractions  Your water breaks.  Sometimes it is a big gush of fluid, sometimes it is just a trickle that keeps getting your panties wet or running down your legs  You have vaginal bleeding.  It is normal to have a small amount of spotting if your cervix was checked.   You don't feel your baby moving like normal.  If you don't, get you something to eat and drink and lay down and focus on feeling your baby move.  You should feel at least 10 movements in 2 hours.  If you don't, you should call the office or go to [facility].    Tdap Vaccine  It is recommended that you get the Tdap vaccine during the third trimester of EACH pregnancy to help protect your baby from getting pertussis (whooping cough)  27-36 weeks is the BEST time to do this so that you can pass the protection on to your baby. During pregnancy is better than after pregnancy, but if you are unable to get it during pregnancy it will be offered at the hospital.   You can get this vaccine with [place], at the health department, your family doctor, or some local pharmacies  Everyone who will be around your baby should also be up-to-date on their vaccines before the baby comes. Adults (who are not pregnant) only need 1 dose of Tdap during adulthood.   [place] Pediatricians/Family Doctors:  [facility] [phone]                 [place] [phone]  (usually not accepting new patients unless you have family there already, you are always welcome to call and ask)       [facility] Department [phone]       [facility] Pediatricians/Family Doctors:   [facility]: [phone]  [facility]: [phone]  [facility]: [place] Doctors:   [facility]: [facility]: [place]:  [place]: [phone]   Home Blood Pressure Monitoring for Patients   Your provider has recommended that you check your blood pressure (BP) at least once a week at home. If you do not have a blood pressure cuff at home, one will be provided for you. Contact your provider if you have not received your monitor within 1 week.   Helpful Tips for Accurate Home Blood Pressure Checks  . Don't smoke, exercise, or drink caffeine 30 minutes before checking your BP . Use the restroom before checking your BP (a full bladder can raise your pressure) . Relax in a comfortable upright chair . Feet on the ground . Left arm resting comfortably on a flat surface at the level of your heart . Legs uncrossed . Back supported . Sit quietly and don't talk . Place the cuff on your bare  arm . Adjust snuggly, so that only two fingertips can fit between your skin and the top of the cuff . Check 2 readings separated by at least one minute . Keep a log of your BP readings . For a visual, please reference this diagram: http://ccnc.care/bpdiagram  Provider Name: [facility]     Phone: [phone]  Zone 1: ALL CLEAR  Continue to monitor your symptoms:  . BP reading is less than 140 (top number) or less than 90 (bottom number)  . No right upper stomach pain . No headaches or seeing spots . No feeling nauseated or throwing up . No swelling in face and hands  Zone 2: CAUTION Call your doctor's office for  any of the following:  . BP reading is greater than 140 (top number) or greater than 90 (bottom number)  . Stomach pain under your ribs in the middle or right side . Headaches or seeing spots . Feeling nauseated or throwing up . Swelling in face and hands  Zone 3: EMERGENCY  Seek immediate medical care if you have any of the following:  . BP reading is greater than160 (top number) or greater than 110 (bottom number) . Severe headaches not improving with Tylenol . Serious difficulty catching your breath . Any worsening symptoms from Zone 2   Third Trimester of Pregnancy The third trimester is from week 29 through week 42, months 7 through 9. The third trimester is a time when the fetus is growing rapidly. At the end of the ninth month, the fetus is about 20 inches in length and weighs 6-10 pounds.  BODY CHANGES Your body goes through many changes during pregnancy. The changes vary from woman to woman.   Your weight will continue to increase. You can expect to gain 25-35 pounds (11-16 kg) by the end of the pregnancy.  You may begin to get stretch marks on your hips, abdomen, and breasts.  You may urinate more often because the fetus is moving lower into your pelvis and pressing on your bladder.  You may develop or continue to have heartburn as a result of your pregnancy.  You may develop constipation because certain hormones are causing the muscles that push waste through your intestines to slow down.  You may develop hemorrhoids or swollen, bulging veins (varicose veins).  You may have pelvic pain because of the weight gain and pregnancy hormones relaxing your joints between the bones in your pelvis. Backaches may result from overexertion of the muscles supporting your posture.  You may have changes in your hair. These can include thickening of your hair, rapid growth, and changes in texture. Some women also have hair loss during or after pregnancy, or hair that feels dry or thin.  Your hair will most likely return to normal after your baby is born.  Your breasts will continue to grow and be tender. A yellow discharge may leak from your breasts called colostrum.  Your belly button may stick out.  You may feel short of breath because of your expanding uterus.  You may notice the fetus "dropping," or moving lower in your abdomen.  You may have a bloody mucus discharge. This usually occurs a few days to a week before labor begins.  Your cervix becomes thin and soft (effaced) near your due date. WHAT TO EXPECT AT YOUR PRENATAL EXAMS  You will have prenatal exams every 2 weeks until week 36. Then, you will have weekly prenatal exams. During a routine prenatal visit:  You  will be weighed to make sure you and the fetus are growing normally.  Your blood pressure is taken.  Your abdomen will be measured to track your baby's growth.  The fetal heartbeat will be listened to.  Any test results from the previous visit will be discussed.  You may have a cervical check near your due date to see if you have effaced. At around 36 weeks, your caregiver will check your cervix. At the same time, your caregiver will also perform a test on the secretions of the vaginal tissue. This test is to determine if a type of bacteria, Group B streptococcus, is present. Your caregiver will explain this further. Your caregiver may ask you:  What your birth plan is.  How you are feeling.  If you are feeling the baby move.  If you have had any abnormal symptoms, such as leaking fluid, bleeding, severe headaches, or abdominal cramping.  If you have any questions. Other tests or screenings that may be performed during your third trimester include:  Blood tests that check for low iron levels (anemia).  Fetal testing to check the health, activity level, and growth of the fetus. Testing is done if you have certain medical conditions or if there are problems during the pregnancy. FALSE  LABOR You may feel small, irregular contractions that eventually go away. These are called Braxton Hicks contractions, or false labor. Contractions may last for hours, days, or even weeks before true labor sets in. If contractions come at regular intervals, intensify, or become painful, it is best to be seen by your caregiver.  SIGNS OF LABOR   Menstrual-like cramps.  Contractions that are 5 minutes apart or less.  Contractions that start on the top of the uterus and spread down to the lower abdomen and back.  A sense of increased pelvic pressure or back pain.  A watery or bloody mucus discharge that comes from the vagina. If you have any of these signs before the 37th week of pregnancy, call your caregiver right away. You need to go to the hospital to get checked immediately. HOME CARE INSTRUCTIONS   Avoid all smoking, herbs, alcohol, and unprescribed drugs. These chemicals affect the formation and growth of the baby.  Follow your caregiver's instructions regarding medicine use. There are medicines that are either safe or unsafe to take during pregnancy.  Exercise only as directed by your caregiver. Experiencing uterine cramps is a good sign to stop exercising.  Continue to eat regular, healthy meals.  Wear a good support bra for breast tenderness.  Do not use hot tubs, steam rooms, or saunas.  Wear your seat belt at all times when driving.  Avoid raw meat, uncooked cheese, cat litter boxes, and soil used by cats. These carry germs that can cause birth defects in the baby.  Take your prenatal vitamins.  Try taking a stool softener (if your caregiver approves) if you develop constipation. Eat more high-fiber foods, such as fresh vegetables or fruit and whole grains. Drink plenty of fluids to keep your urine clear or pale yellow.  Take warm sitz baths to soothe any pain or discomfort caused by hemorrhoids. Use hemorrhoid cream if your caregiver approves.  If you develop varicose  veins, wear support hose. Elevate your feet for 15 minutes, 3-4 times a day. Limit salt in your diet.  Avoid heavy lifting, wear low heal shoes, and practice good posture.  Rest a lot with your legs elevated if you have leg cramps or low back  pain.  Visit your dentist if you have not gone during your pregnancy. Use a soft toothbrush to brush your teeth and be gentle when you floss.  A sexual relationship may be continued unless your caregiver directs you otherwise.  Do not travel far distances unless it is absolutely necessary and only with the approval of your caregiver.  Take prenatal classes to understand, practice, and ask questions about the labor and delivery.  Make a trial run to the hospital.  Pack your hospital bag.  Prepare the baby's nursery.  Continue to go to all your prenatal visits as directed by your caregiver. SEEK MEDICAL CARE IF:  You are unsure if you are in labor or if your water has broken.  You have dizziness.  You have mild pelvic cramps, pelvic pressure, or nagging pain in your abdominal area.  You have persistent nausea, vomiting, or diarrhea.  You have a bad smelling vaginal discharge.  You have pain with urination. SEEK IMMEDIATE MEDICAL CARE IF:   You have a fever.  You are leaking fluid from your vagina.  You have spotting or bleeding from your vagina.  You have severe abdominal cramping or pain.  You have rapid weight loss or gain.  You have shortness of breath with chest pain.  You notice sudden or extreme swelling of your face, hands, ankles, feet, or legs.  You have not felt your baby move in over an hour.  You have severe headaches that do not go away with medicine.  You have vision changes. Document Released: 06/15/2001 Document Revised: 06/26/2013 Document Reviewed: 08/22/2012 [employer] Patient Information 2015 [place], [place]. This information is not intended to replace advice given to you by your health care provider. Make  sure you discuss any questions you have with your health care provider.

## 2020-05-07 NOTE — Progress Notes (Signed)
   LOW-RISK PREGNANCY VISIT Patient name: Sara Kirby MRN [id number]  Date of birth: Dec 14, 1990 Chief Complaint:   Routine Prenatal Visit  History of Present Illness:   Sara Kirby is a 29 y.o. G69P1001 female at [redacted]w[redacted]d with an Estimated Date of Delivery: 06/18/20 being seen today for ongoing management of a low-risk pregnancy.  Today she reports doing well. Contractions: Not present. Vag. Bleeding: None.  Movement: Present. denies leaking of fluid. Review of Systems:   Pertinent items are noted in HPI Denies abnormal vaginal discharge w/ itching/odor/irritation, headaches, visual changes, shortness of breath, chest pain, abdominal pain, severe nausea/vomiting, or problems with urination or bowel movements unless otherwise stated above. Pertinent History Reviewed:  Reviewed past medical,surgical, social, obstetrical and family history.  Reviewed problem list, medications and allergies. Physical Assessment:   Vitals:   05/07/20 1036  BP: 129/74  Pulse: 100  Weight: 179 lb 9.6 oz (81.5 kg)  Body mass index is 28.13 kg/m.        Physical Examination:   General appearance: Well appearing, and in no distress  Mental status: Alert, oriented to person, place, and time  Skin: Warm & dry  Cardiovascular: Normal heart rate noted  Respiratory: Normal respiratory effort, no distress  Abdomen: Soft, gravid, nontender  Pelvic: Cervical exam deferred         Extremities: Edema: Trace  Fetal Status: Fetal Heart Rate (bpm): 152 Fundal Height: 33 cm Movement: Present    Results for orders placed or performed in visit on 05/07/20 (from the past 24 hour(s))  POC Urinalysis Dipstick OB   Collection Time: 05/07/20 10:35 AM  Result Value Ref Range   Color, UA     Clarity, UA     Glucose, UA Negative Negative   Bilirubin, UA     Ketones, UA trace    Spec Grav, UA     Blood, UA neg    pH, UA     POC,PROTEIN,UA Negative Negative, Trace, Small (1+), Moderate (2+), Large (3+), 4+    Urobilinogen, UA     Nitrite, UA neg    Leukocytes, UA Negative Negative   Appearance     Odor      Assessment & Plan:  1) Low-risk pregnancy G2P1001 at [redacted]w[redacted]d with an Estimated Date of Delivery: 06/18/20     Meds: No orders of the defined types were placed in this encounter.  Labs/procedures today: none  Plan:  Continue routine obstetrical care   Reviewed: Preterm labor symptoms and general obstetric precautions including but not limited to vaginal bleeding, contractions, leaking of fluid and fetal movement were reviewed in detail with the patient.  All questions were answered. Has home bp cuff. Check bp weekly, let us know if >140/90.   Follow-up: Return in about 2 weeks (around 05/21/2020) for [place], in person; GBS/GC/chlam.  Orders Placed This Encounter  Procedures  . POC Urinalysis Dipstick OB   Myrtis Ser [facility] 05/07/2020 10:49 AM

## 2020-05-22 ENCOUNTER — Other Ambulatory Visit (HOSPITAL_COMMUNITY)
Admission: RE | Admit: 2020-05-22 | Discharge: 2020-05-22 | Disposition: A | Discharge status: Home / Self Care | Payer: 59 | Source: Ambulatory Visit | Attending: Obstetrics & Gynecology | Admitting: Obstetrics & Gynecology

## 2020-05-22 ENCOUNTER — Other Ambulatory Visit: Payer: Self-pay

## 2020-05-22 ENCOUNTER — Encounter: Payer: Self-pay | Admitting: Obstetrics & Gynecology

## 2020-05-22 ENCOUNTER — Ambulatory Visit (INDEPENDENT_AMBULATORY_CARE_PROVIDER_SITE_OTHER): Payer: 59 | Admitting: Obstetrics & Gynecology

## 2020-05-22 VITALS — BP 127/78 | HR 101 | Wt 185.0 lb

## 2020-05-22 DIAGNOSIS — Z1389 Encounter for screening for other disorder: Secondary | ICD-10-CM

## 2020-05-22 DIAGNOSIS — Z7689 Persons encountering health services in other specified circumstances: Secondary | ICD-10-CM | POA: Diagnosis not present

## 2020-05-22 DIAGNOSIS — Z3A36 36 weeks gestation of pregnancy: Secondary | ICD-10-CM | POA: Insufficient documentation

## 2020-05-22 DIAGNOSIS — Z331 Pregnant state, incidental: Secondary | ICD-10-CM

## 2020-05-22 DIAGNOSIS — Z3480 Encounter for supervision of other normal pregnancy, unspecified trimester: Secondary | ICD-10-CM

## 2020-05-22 LAB — POCT URINALYSIS DIPSTICK OB
Blood, UA: NEGATIVE
Glucose, UA: NEGATIVE
Ketones, UA: NEGATIVE
Leukocytes, UA: NEGATIVE
Nitrite, UA: NEGATIVE
POC,PROTEIN,UA: NEGATIVE

## 2020-05-22 NOTE — Progress Notes (Signed)
LOW-RISK PREGNANCY VISIT Patient name: Sara Kirby MRN [id number]  Date of birth: August 13, 1990 Chief Complaint:   Routine Prenatal Visit  History of Present Illness:   Sara Kirby is a 29 y.o. G78P1001 female at [redacted]w[redacted]d with an Estimated Date of Delivery: 06/18/20 being seen today for ongoing management of a low-risk pregnancy.  Depression screen [facility] 2/9 03/26/2020 12/05/2019 07/07/2016 06/10/2016  Decreased Interest 0 0 0 0  Down, Depressed, Hopeless 0 0 0 0  PHQ - 2 Score 0 0 0 0  Altered sleeping 0 0 0 -  Tired, decreased energy 2 1 1  -  Change in appetite 0 0 0 -  Feeling bad or failure about yourself  0 0 0 -  Trouble concentrating 0 0 0 -  Moving slowly or fidgety/restless 0 0 0 -  Suicidal thoughts 0 0 0 -  PHQ-9 Score 2 1 1  -  Difficult doing work/chores - Not difficult at all - -  Some encounter information is confidential and restricted. Go to Review Flowsheets activity to see all data.    Today she reports no complaints. Contractions: Not present. Vag. Bleeding: None.  Movement: Present. denies leaking of fluid. Review of Systems:   Pertinent items are noted in HPI Denies abnormal vaginal discharge w/ itching/odor/irritation, headaches, visual changes, shortness of breath, chest pain, abdominal pain, severe nausea/vomiting, or problems with urination or bowel movements unless otherwise stated above. Pertinent History Reviewed:  Reviewed past medical,surgical, social, obstetrical and family history.  Reviewed problem list, medications and allergies. Physical Assessment:   Vitals:   05/22/20 1122  BP: 127/78  Pulse: (!) 101  Weight: 185 lb (83.9 kg)  Body mass index is 28.98 kg/m.        Physical Examination:   General appearance: Well appearing, and in no distress  Mental status: Alert, oriented to person, place, and time  Skin: Warm & dry  Cardiovascular: Normal heart rate noted  Respiratory: Normal respiratory effort, no distress  Abdomen: Soft, gravid,  nontender  Pelvic: Cervical exam performed         Extremities: Edema: Trace  Fetal Status: Fetal Heart Rate (bpm): 134 Fundal Height: 36 cm Movement: Present    Chaperone: Angel    Results for orders placed or performed in visit on 05/22/20 (from the past 24 hour(s))  POC Urinalysis Dipstick OB   Collection Time: 05/22/20 11:18 AM  Result Value Ref Range   Color, UA     Clarity, UA     Glucose, UA Negative Negative   Bilirubin, UA     Ketones, UA neg    Spec Grav, UA     Blood, UA neg    pH, UA     POC,PROTEIN,UA Negative Negative, Trace, Small (1+), Moderate (2+), Large (3+), 4+   Urobilinogen, UA     Nitrite, UA neg    Leukocytes, UA Negative Negative   Appearance     Odor      Assessment & Plan:  1) Low-risk pregnancy G2P1001 at [redacted]w[redacted]d with an Estimated Date of Delivery: 06/18/20   2) , stop prometrium   Meds: No orders of the defined types were placed in this encounter.  Labs/procedures today: cultures  Plan:  Continue routine obstetrical care  Next visit: prefers in person    Reviewed:  labor symptoms and general obstetric precautions including but not limited to vaginal bleeding, contractions, leaking of fluid and fetal movement were reviewed in detail with the patient.  All questions were answered.  Has home bp cuff. Rx faxed to . Check bp weekly, let us know if >140/90.   Follow-up: Return in about 11 days (around 06/02/2020) for [place].  Orders Placed This Encounter  Procedures  . Culture, beta strep (group b only)  . POC Urinalysis Dipstick OB    Florian Buff, MD 05/22/2020 12:06 PM

## 2020-05-23 ENCOUNTER — Telehealth: Payer: 59 | Admitting: Emergency Medicine

## 2020-05-23 DIAGNOSIS — J329 Chronic sinusitis, unspecified: Secondary | ICD-10-CM

## 2020-05-23 LAB — CERVICOVAGINAL ANCILLARY ONLY
Chlamydia: NEGATIVE
Comment: NEGATIVE
Comment: NORMAL
Neisseria Gonorrhea: NEGATIVE

## 2020-05-23 NOTE — Progress Notes (Signed)
We are sorry that you are not feeling well.  Here is how we plan to help!  Based on what you have shared with me it looks like you have sinusitis.  Sinusitis is inflammation and infection in the sinus cavities of the head.  Based on your presentation I believe you most likely have Acute Viral Sinusitis.This is an infection most likely caused by a virus. There is not specific treatment for viral sinusitis other than to help you with the symptoms until the infection runs its course.  In pregnancy, I would recommend nasal saline, used as needed along with Zyrtec or Claritin once daily.  Some authorities believe that zinc sprays or the use of Echinacea may shorten the course of your symptoms.  Sinus infections are not as easily transmitted as other respiratory infection, however we still recommend that you avoid close contact with loved ones, especially the very young and elderly.  Remember to wash your hands thoroughly throughout the day as this is the number one way to prevent the spread of infection!  Home Care:  Only take medications as instructed by your medical team.  Do not take these medications with alcohol.  A steam or ultrasonic humidifier can help congestion.  You can place a towel over your head and breathe in the steam from hot water coming from a faucet.  Avoid close contacts especially the very young and the elderly.  Cover your mouth when you cough or sneeze.  Always remember to wash your hands.  Get Help Right Away If:  You develop worsening fever or sinus pain.  You develop a severe head ache or visual changes.  Your symptoms persist after you have completed your treatment plan.  Make sure you  Understand these instructions.  Will watch your condition.  Will get help right away if you are not doing well or get worse.  Your e-visit answers were reviewed by a board certified advanced clinical practitioner to complete your personal care plan.  Depending on the  condition, your plan could have included both over the counter or prescription medications.  If there is a problem please reply  once you have received a response from your provider.  Your safety is important to [place].  If you have drug allergies check your prescription carefully.    You can use MyChart to ask questions about today's visit, request a non-urgent call back, or ask for a work or school excuse for 24 hours related to this e-Visit. If it has been greater than 24 hours you will need to follow up with your provider, or enter a new e-Visit to address those concerns.  You will get an e-mail in the next two days asking about your experience.  I hope that your e-visit has been valuable and will speed your recovery. Thank you for using e-visits.   Approximately 5 minutes was used in reviewing the patient's chart, questionnaire, prescribing medications, and documentation.

## 2020-05-26 LAB — CULTURE, BETA STREP (GROUP B ONLY): Strep Gp B Culture: NEGATIVE

## 2020-06-02 ENCOUNTER — Ambulatory Visit (INDEPENDENT_AMBULATORY_CARE_PROVIDER_SITE_OTHER): Payer: 59 | Admitting: Advanced Practice Midwife

## 2020-06-02 ENCOUNTER — Other Ambulatory Visit: Payer: Self-pay

## 2020-06-02 VITALS — BP 132/76 | HR 84 | Wt 187.0 lb

## 2020-06-02 DIAGNOSIS — Z1389 Encounter for screening for other disorder: Secondary | ICD-10-CM

## 2020-06-02 DIAGNOSIS — Z331 Pregnant state, incidental: Secondary | ICD-10-CM

## 2020-06-02 DIAGNOSIS — Z3483 Encounter for supervision of other normal pregnancy, third trimester: Secondary | ICD-10-CM

## 2020-06-02 DIAGNOSIS — Z3A37 37 weeks gestation of pregnancy: Secondary | ICD-10-CM

## 2020-06-02 LAB — POCT URINALYSIS DIPSTICK OB
Blood, UA: NEGATIVE
Glucose, UA: NEGATIVE
Leukocytes, UA: NEGATIVE
Nitrite, UA: NEGATIVE

## 2020-06-02 NOTE — Patient Instructions (Signed)

## 2020-06-02 NOTE — Progress Notes (Signed)
   LOW-RISK PREGNANCY VISIT Patient name: Sara Kirby MRN [id number]  Date of birth: 02/20/1991 Chief Complaint:   Routine Prenatal Visit  History of Present Illness:   Ninoska M Roeder is a 29 y.o. G61P1001 female at [redacted]w[redacted]d with an Estimated Date of Delivery: 06/18/20 being seen today for ongoing management of a low-risk pregnancy.  Today she reports no complaints. Contractions: Not present. Vag. Bleeding: None.  Movement: Present. denies leaking of fluid. Review of Systems:   Pertinent items are noted in HPI Denies abnormal vaginal discharge w/ itching/odor/irritation, headaches, visual changes, shortness of breath, chest pain, abdominal pain, severe nausea/vomiting, or problems with urination or bowel movements unless otherwise stated above. Pertinent History Reviewed:  Reviewed past medical,surgical, social, obstetrical and family history.  Reviewed problem list, medications and allergies. Physical Assessment:   Vitals:   06/02/20 1118  BP: 132/76  Pulse: 84  Weight: 187 lb (84.8 kg)  Body mass index is 29.29 kg/m.        Physical Examination:   General appearance: Well appearing, and in no distress  Mental status: Alert, oriented to person, place, and time  Skin: Warm & dry  Cardiovascular: Normal heart rate noted  Respiratory: Normal respiratory effort, no distress  Abdomen: Soft, gravid, nontender  Pelvic: Cervical exam performed         Extremities: Edema: Trace  Fetal Status:     Movement: Present    Chaperone: Amanda Rash    Results for orders placed or performed in visit on 06/02/20 (from the past 24 hour(s))  POC Urinalysis Dipstick OB   Collection Time: 06/02/20 11:21 AM  Result Value Ref Range   Color, UA     Clarity, UA     Glucose, UA Negative Negative   Bilirubin, UA     Ketones, UA trace    Spec Grav, UA     Blood, UA neg    pH, UA     POC,PROTEIN,UA Trace Negative, Trace, Small (1+), Moderate (2+), Large (3+), 4+   Urobilinogen, UA     Nitrite, UA  neg    Leukocytes, UA Negative Negative   Appearance     Odor      Assessment & Plan:  1) Low-risk pregnancy G2P1001 at [redacted]w[redacted]d with an Estimated Date of Delivery: 06/18/20      Meds: No orders of the defined types were placed in this encounter.  Labs/procedures today:   Plan:  Continue routine obstetrical care  Next visit: prefers in person    Reviewed: Term labor symptoms and general obstetric precautions including but not limited to vaginal bleeding, contractions, leaking of fluid and fetal movement were reviewed in detail with the patient.  All questions were answered. Has home bp cuff. Check bp weekly, let us know if >140/90.   Follow-up: No follow-ups on file.  Orders Placed This Encounter  Procedures  . POC Urinalysis Dipstick OB   Christin Fudge DNP, CNM 06/02/2020 11:28 AM

## 2020-06-05 ENCOUNTER — Inpatient Hospital Stay (HOSPITAL_COMMUNITY)
Admission: AD | Admit: 2020-06-05 | Discharge: 2020-06-06 | DRG: 807 | Disposition: A | Discharge status: Home / Self Care | Payer: 59 | Attending: Obstetrics and Gynecology | Admitting: Obstetrics and Gynecology

## 2020-06-05 ENCOUNTER — Other Ambulatory Visit: Payer: Self-pay

## 2020-06-05 ENCOUNTER — Encounter (HOSPITAL_COMMUNITY): Payer: Self-pay | Admitting: Obstetrics & Gynecology

## 2020-06-05 DIAGNOSIS — Z20822 Contact with and (suspected) exposure to covid-19: Secondary | ICD-10-CM | POA: Diagnosis not present

## 2020-06-05 DIAGNOSIS — Z3A38 38 weeks gestation of pregnancy: Secondary | ICD-10-CM

## 2020-06-05 DIAGNOSIS — O26893 Other specified pregnancy related conditions, third trimester: Secondary | ICD-10-CM | POA: Diagnosis present

## 2020-06-05 DIAGNOSIS — Z9889 Other specified postprocedural states: Secondary | ICD-10-CM | POA: Diagnosis not present

## 2020-06-05 DIAGNOSIS — O4202 Full-term premature rupture of membranes, onset of labor within 24 hours of rupture: Secondary | ICD-10-CM

## 2020-06-05 DIAGNOSIS — Z3480 Encounter for supervision of other normal pregnancy, unspecified trimester: Secondary | ICD-10-CM

## 2020-06-05 LAB — CBC
HCT: 36.8 % (ref 36.0–46.0)
Hemoglobin: 11.9 g/dL — ABNORMAL LOW (ref 12.0–15.0)
MCH: 30.5 pg (ref 26.0–34.0)
MCHC: 32.3 g/dL (ref 30.0–36.0)
MCV: 94.4 fL (ref 80.0–100.0)
Platelets: 379 10*3/uL (ref 150–400)
RBC: 3.9 MIL/uL (ref 3.87–5.11)
RDW: 12.8 % (ref 11.5–15.5)
WBC: 15.6 10*3/uL — ABNORMAL HIGH (ref 4.0–10.5)
nRBC: 0 % (ref 0.0–0.2)

## 2020-06-05 LAB — RESP PANEL BY RT-PCR (FLU A&B, COVID) ARPGX2
Influenza A by PCR: NEGATIVE
Influenza B by PCR: NEGATIVE
SARS Coronavirus 2 by RT PCR: NEGATIVE

## 2020-06-05 LAB — RPR: RPR Ser Ql: NONREACTIVE

## 2020-06-05 LAB — TYPE AND SCREEN
ABO/RH(D): B POS
Antibody Screen: NEGATIVE

## 2020-06-05 MED ORDER — OXYTOCIN BOLUS FROM INFUSION
333.0000 mL | Freq: Once | INTRAVENOUS | Status: AC
  Administered 2020-06-05: 333 mL via INTRAVENOUS

## 2020-06-05 MED ORDER — WITCH HAZEL-GLYCERIN EX PADS
1.0000 "application " | MEDICATED_PAD | CUTANEOUS | Status: DC | PRN
  Administered 2020-06-05: 1 via TOPICAL

## 2020-06-05 MED ORDER — LACTATED RINGERS IV SOLN: 500.0000 mL | Freq: Once | INTRAVENOUS | Status: DC

## 2020-06-05 MED ORDER — PHENYLEPHRINE 40 MCG/ML (10ML) SYRINGE FOR IV PUSH (FOR BLOOD PRESSURE SUPPORT): 80.0000 ug | PREFILLED_SYRINGE | INTRAVENOUS | Status: DC | PRN

## 2020-06-05 MED ORDER — OXYTOCIN-SODIUM CHLORIDE 30-0.9 UT/500ML-% IV SOLN
2.5000 [IU]/h | INTRAVENOUS | Status: DC
  Filled 2020-06-05: qty 500

## 2020-06-05 MED ORDER — EPHEDRINE 5 MG/ML INJ: 10.0000 mg | INTRAVENOUS | Status: DC | PRN

## 2020-06-05 MED ORDER — LACTATED RINGERS IV SOLN: INTRAVENOUS | Status: DC

## 2020-06-05 MED ORDER — ACETAMINOPHEN 325 MG PO TABS: 650.0000 mg | ORAL_TABLET | ORAL | Status: DC | PRN

## 2020-06-05 MED ORDER — DIBUCAINE (PERIANAL) 1 % EX OINT: 1.0000 "application " | TOPICAL_OINTMENT | CUTANEOUS | Status: DC | PRN

## 2020-06-05 MED ORDER — MEDROXYPROGESTERONE ACETATE 150 MG/ML IM SUSP
150.0000 mg | Freq: Once | INTRAMUSCULAR | Status: AC
  Administered 2020-06-06: 150 mg via INTRAMUSCULAR
  Filled 2020-06-05: qty 1

## 2020-06-05 MED ORDER — OXYCODONE-ACETAMINOPHEN 5-325 MG PO TABS: 2.0000 | ORAL_TABLET | ORAL | Status: DC | PRN

## 2020-06-05 MED ORDER — FENTANYL CITRATE (PF) 100 MCG/2ML IJ SOLN: 50.0000 ug | INTRAMUSCULAR | Status: DC | PRN

## 2020-06-05 MED ORDER — OXYCODONE-ACETAMINOPHEN 5-325 MG PO TABS: 1.0000 | ORAL_TABLET | ORAL | Status: DC | PRN

## 2020-06-05 MED ORDER — SIMETHICONE 80 MG PO CHEW: 80.0000 mg | CHEWABLE_TABLET | ORAL | Status: DC | PRN

## 2020-06-05 MED ORDER — FENTANYL-BUPIVACAINE-NACL 0.5-0.125-0.9 MG/250ML-% EP SOLN
12.0000 mL/h | EPIDURAL | Status: DC | PRN
  Filled 2020-06-05: qty 250

## 2020-06-05 MED ORDER — ONDANSETRON HCL 4 MG PO TABS: 4.0000 mg | ORAL_TABLET | ORAL | Status: DC | PRN

## 2020-06-05 MED ORDER — BENZOCAINE-MENTHOL 20-0.5 % EX AERO
1.0000 "application " | INHALATION_SPRAY | CUTANEOUS | Status: DC | PRN
  Administered 2020-06-05: 1 via TOPICAL
  Filled 2020-06-05: qty 56

## 2020-06-05 MED ORDER — COCONUT OIL OIL: 1.0000 "application " | TOPICAL_OIL | Status: DC | PRN

## 2020-06-05 MED ORDER — PRENATAL MULTIVITAMIN CH
1.0000 | ORAL_TABLET | Freq: Every day | ORAL | Status: DC
  Administered 2020-06-06: 1 via ORAL
  Filled 2020-06-05: qty 1

## 2020-06-05 MED ORDER — DIPHENHYDRAMINE HCL 25 MG PO CAPS: 25.0000 mg | ORAL_CAPSULE | Freq: Four times a day (QID) | ORAL | Status: DC | PRN

## 2020-06-05 MED ORDER — ONDANSETRON HCL 4 MG/2ML IJ SOLN: 4.0000 mg | INTRAMUSCULAR | Status: DC | PRN

## 2020-06-05 MED ORDER — DIPHENHYDRAMINE HCL 50 MG/ML IJ SOLN: 12.5000 mg | INTRAMUSCULAR | Status: DC | PRN

## 2020-06-05 MED ORDER — LACTATED RINGERS IV SOLN: 500.0000 mL | INTRAVENOUS | Status: DC | PRN

## 2020-06-05 MED ORDER — ONDANSETRON HCL 4 MG/2ML IJ SOLN: 4.0000 mg | Freq: Four times a day (QID) | INTRAMUSCULAR | Status: DC | PRN

## 2020-06-05 MED ORDER — SOD CITRATE-CITRIC ACID 500-334 MG/5ML PO SOLN: 30.0000 mL | ORAL | Status: DC | PRN

## 2020-06-05 MED ORDER — SENNOSIDES-DOCUSATE SODIUM 8.6-50 MG PO TABS
2.0000 | ORAL_TABLET | ORAL | Status: DC
  Administered 2020-06-05: 2 via ORAL
  Filled 2020-06-05: qty 2

## 2020-06-05 MED ORDER — LIDOCAINE HCL (PF) 1 % IJ SOLN
30.0000 mL | INTRAMUSCULAR | Status: AC | PRN
  Administered 2020-06-05: 30 mL via SUBCUTANEOUS
  Filled 2020-06-05: qty 30

## 2020-06-05 MED ORDER — IBUPROFEN 600 MG PO TABS
600.0000 mg | ORAL_TABLET | Freq: Four times a day (QID) | ORAL | Status: DC
  Administered 2020-06-05 – 2020-06-06 (×4): 600 mg via ORAL
  Filled 2020-06-05 (×4): qty 1

## 2020-06-05 NOTE — H&P (Addendum)
OBSTETRIC ADMISSION HISTORY AND PHYSICAL  Sara Kirby is a 29 y.o. female [phone] with IUP at [redacted]w[redacted]d by LMP presenting for active labor. She reports +FMs, No LOF, no VB, no blurry vision, headaches or peripheral edema, and RUQ pain.  She plans on breastfeeding. She request nothing for birth control. She received her prenatal care at [facility]   Dating: By LMP --->  Estimated Date of Delivery: 06/18/20  Sono: Normal on  03/12/2020  [redacted]w[redacted]d, CWD, normal anatomy, cephalic presentation, stable lie  Prenatal History/Complications: Precipitous delivery  Past Medical History: Past Medical History:  Diagnosis Date   Contraceptive management 04/02/2013   Patient desires pregnancy 01/09/2016   UTI (urinary tract infection) 02/27/2013    Past Surgical History: Past Surgical History:  Procedure Laterality Date   URETHRAL DILATION     WISDOM TOOTH EXTRACTION      Obstetrical History: OB History     Gravida  2   Para  2   Term  2   Preterm  0   AB  0   Living  2      SAB  0   TAB  0   Ectopic  0   Multiple  0   Live Births  2           Social History Social History   Socioeconomic History   Marital status: Married    Spouse name: Ader Fritze, Brooke Bonito.   Number of children: Not on file   Years of education: Not on file   Highest education level: Not on file  Occupational History   Not on file  Tobacco Use   Smoking status: Never Smoker   Smokeless tobacco: Never Used  Vaping Use   Vaping Use: Never used  Substance and Sexual Activity   Alcohol use: Not Currently   Drug use: No   Sexual activity: Yes    Birth control/protection: None  Other Topics Concern   Not on file  Social History Narrative   Not on file   Social Determinants of Health   Financial Resource Strain: Low Risk    Difficulty of Paying Living Expenses: Not hard at all  Food Insecurity: No Food Insecurity   Worried About Charity fundraiser in the Last Year: Never true   National in the Last Year: Never true  Transportation Needs: No Transportation Needs   Lack of Transportation (Medical): No   Lack of Transportation (Non-Medical): No  Physical Activity: Insufficiently Active   Days of Exercise per Week: 3 days   Minutes of Exercise per Session: 30 min  Stress: No Stress Concern Present   Feeling of Stress : Only a little  Social Connections: Moderately Integrated   Frequency of Communication with Friends and Family: Once a week   Frequency of Social Gatherings with Friends and Family: Twice a week   Attends Religious Services: More than 4 times per year   Active Member of [employer] or Organizations: No   Attends Music therapist: Never   Marital Status: Married    Family History: Family History  Problem Relation Age of Onset   Cancer Other        uterine    Alcohol abuse Father    Depression Father    Hypertension Father    Stroke Father    Hypertension Maternal Grandmother    Hypertension Maternal Grandfather    Heart disease Maternal Grandfather    COPD Maternal Grandfather  Allergies: No Known Allergies  Medications Prior to Admission  Medication Sig Dispense Refill Last Dose   fluticasone (FLONASE) 50 MCG/ACT nasal spray Place 1 spray into both nostrils daily as needed.       guaiFENesin (MUCINEX) 600 MG 12 hr tablet Take 600 mg by mouth 2 (two) times daily as needed. (Patient not taking: Reported on 06/02/2020)      loratadine (CLARITIN) 10 MG tablet Take 10 mg by mouth daily as needed.       pantoprazole (PROTONIX) 20 MG tablet Take 1 tablet (20 mg total) by mouth daily. 30 tablet 6    Prenatal Vit-Fe Fumarate-FA (MULTIVITAMIN-PRENATAL) 27-0.8 MG TABS tablet Take 1 tablet by mouth daily at 12 noon.      progesterone (PROMETRIUM) 200 MG capsule Place 200 mg vaginally daily. (Patient not taking: Reported on 06/02/2020)        Review of Systems   All systems reviewed and negative except as stated in HPI  Blood pressure  119/73, pulse 96, temperature 98.9 F (37.2 C), temperature source Oral, resp. rate 18, height 5\' 7"  (1.702 m), weight 85.1 kg, last menstrual period 09/12/2019, SpO2 98 %, unknown if currently breastfeeding. General appearance: alert, cooperative, appears stated age, mild distress and contracting Lungs: clear to auscultation bilaterally Heart: regular rate and rhythm Abdomen: soft, non-tender; bowel sounds normal Pelvic: adequate for labor Presentation: cephalic Fetal monitoringBaseline: 145 bpm, Variability: Good {> 6 bpm), Accelerations: Reactive and Decelerations: Absent Uterine activityFrequency: Every 1.5-2.5 minutes, Duration: 60-90 seconds and Intensity: strong Dilation: 10 Effacement (%): 100 Station: [place] Exam by:: Candie Chroman, CNM   Prenatal labs: ABO, Rh: --/--/B POS (12/02 1231) Antibody: NEG (12/02 1231) Rubella: 3.15 (06/02 1229) RPR: NON REACTIVE (12/02 1147)  HBsAg: Negative (06/02 1229)  HIV: Non Reactive (09/22 0847)  GBS: Negative/-- (11/18 1451)  1 hr Glucola 90 2 hr Glucola 83 Genetic screening: Normal, low risk  Anatomy US: Normal   Prenatal Transfer Tool  Maternal Diabetes: No Genetic Screening: Normal Maternal Ultrasounds/Referrals: Normal Fetal Ultrasounds or other Referrals:  None Maternal Substance Abuse:  No Significant Maternal Medications:  None Significant Maternal Lab Results: Group B Strep negative  Results for orders placed or performed during the hospital encounter of 06/05/20 (from the past 24 hour(s))  CBC   Collection Time: 06/05/20 11:47 AM  Result Value Ref Range   WBC 15.6 (H) 4.0 - 10.5 K/uL   RBC 3.90 3.87 - 5.11 MIL/uL   Hemoglobin 11.9 (L) 12.0 - 15.0 g/dL   HCT 36.8 36 - 46 %   MCV 94.4 80.0 - 100.0 fL   MCH 30.5 26.0 - 34.0 pg   MCHC 32.3 30.0 - 36.0 g/dL   RDW 12.8 11.5 - 15.5 %   Platelets 379 150 - 400 K/uL   nRBC 0.0 0.0 - 0.2 %  RPR   Collection Time: 06/05/20 11:47 AM  Result Value Ref Range   RPR Ser Ql  NON REACTIVE NON REACTIVE  Resp Panel by RT-PCR (Flu A&B, Covid) Nasopharyngeal Swab   Collection Time: 06/05/20 12:04 PM   Specimen: Nasopharyngeal Swab; Nasopharyngeal(NP) swabs in vial transport medium  Result Value Ref Range   SARS Coronavirus 2 by RT PCR NEGATIVE NEGATIVE   Influenza A by PCR NEGATIVE NEGATIVE   Influenza B by PCR NEGATIVE NEGATIVE  Type and screen [place]   Collection Time: 06/05/20 12:31 PM  Result Value Ref Range   ABO/RH(D) B POS    Antibody Screen NEG  Sample Expiration      06/08/2020,2359 Performed at [facility], [place] [address] [place], [place] [address]     Patient Active Problem List   Diagnosis Date Noted   Indication for care in labor and delivery, antepartum 06/05/2020   Short cervix during pregnancy in second trimester 01/23/2020   Encounter for supervision of other normal pregnancy, unspecified trimester 12/05/2019   Rectal fissure 07/16/2019    Assessment/Plan:  Sara Kirby is a 29 y.o. S3M1962 at [redacted]w[redacted]d here for active labor.  #Labor: Active #Pain: None #FWB: Category I #ID: GBS negative #MOF: breast #MOC:Depo before discharge #Circ:  Yes   Valli Glance, Student-MidWife  06/05/2020, 3:19 PM  Attestation of Supervision of Student:  I confirm that I have verified the information documented in the nurse midwife student's note and that I have also personally performed the history, physical exam and all medical decision making activities.  I have verified that all services and findings are accurately documented in this student's note; and I agree with management and plan as outlined in the documentation. I have also made any necessary editorial changes.  Gabriel Carina, [place] for [employer], [place] [employer] 06/05/2020 5:04 PM

## 2020-06-05 NOTE — Discharge Summary (Addendum)
Postpartum Discharge Summary  Patient Name: Sara Kirby DOB: 09-Oct-1990 MRN: [id number]  Date of admission: 06/05/2020 Delivery date:06/05/2020  Delivering provider: Gaylan Gerold R  Date of discharge: 06/06/2020  Admitting diagnosis: Indication for care in labor and delivery, antepartum [O75.9] Intrauterine pregnancy: [redacted]w[redacted]d    Secondary diagnosis:  Active Problems:   Indication for care in labor and delivery, antepartum  Additional problems: None    Discharge diagnosis: Term Pregnancy Delivered                                              Post partum procedures: None Augmentation: N/A Complications: None  Hospital course: Onset of Labor With Vaginal Delivery      29y.o. yo [id number] 390w1das admitted in Active Labor on 06/05/2020. Patient had an uncomplicated labor course as follows:  Membrane Rupture Time/Date: 12:32 PM ,06/05/2020   Delivery Method:Vaginal, Spontaneous  Episiotomy: None  Lacerations:  2nd degree;Perineal  Patient had an uncomplicated postpartum course.  She is ambulating, tolerating a regular diet, passing flatus, and urinating well. Patient is discharged home in stable condition on 06/06/20.  Newborn Data: Birth date:06/05/2020  Birth time:12:40 PM  Gender:Female  Living status:Living  Apgars:8 ,9  Weight:3606 g   Magnesium Sulfate received: No BMZ received: No Rhophylac:N/A MMR:N/A T-DaP:Given prenatally Flu: N/A Transfusion:No  Physical exam  Vitals:   06/05/20 1525 06/05/20 1957 06/05/20 2305 06/06/20 0400  BP: 118/66 110/70 123/74 115/83  Pulse: 80 79 74 71  Resp: '16 18 17 18  ' Temp: 98.2 F (36.8 C) 97.9 F (36.6 C) 97.7 F (36.5 C) 97.9 F (36.6 C)  TempSrc:  Oral Oral Oral  SpO2:  100% 100% 98%  Weight:      Height:       General: alert Lochia: appropriate Uterine Fundus: firm Incision: N/A DVT Evaluation: No evidence of DVT seen on physical exam. Labs: Lab Results  Component Value Date   WBC 16.7 (H) 06/06/2020   HGB  9.9 (L) 06/06/2020   HCT 30.6 (L) 06/06/2020   MCV 92.7 06/06/2020   PLT 325 06/06/2020   No flowsheet data found. Edinburgh Score: Edinburgh Postnatal Depression Scale Screening Tool 06/05/2020  I have been able to laugh and see the funny side of things. (No Data)  I have looked forward with enjoyment to things. -  I have blamed myself unnecessarily when things went wrong. -  I have been anxious or worried for no good reason. -  I have felt scared or panicky for no good reason. -  Things have been getting on top of me. -  I have been so unhappy that I have had difficulty sleeping. -  I have felt sad or miserable. -  I have been so unhappy that I have been crying. -  The thought of harming myself has occurred to me. - Flavia Shipperostnatal Depression Scale Total -  Some encounter information is confidential and restricted. Go to Review Flowsheets activity to see all data.   After visit meds:  Allergies as of 06/06/2020   No Known Allergies      Medication List     STOP taking these medications    progesterone 200 MG capsule Commonly known as: PROMETRIUM       TAKE these medications    fluticasone 50 MCG/ACT nasal spray Commonly known as: FLONASE Place 1  spray into both nostrils daily as needed.   loratadine 10 MG tablet Commonly known as: CLARITIN Take 10 mg by mouth daily as needed.   Mucinex 600 MG 12 hr tablet Generic drug: guaiFENesin Take 600 mg by mouth 2 (two) times daily as needed.   multivitamin-prenatal 27-0.8 MG Tabs tablet Take 1 tablet by mouth daily at 12 noon.   pantoprazole 20 MG tablet Commonly known as: Protonix Take 1 tablet (20 mg total) by mouth daily.        Discharge home in stable condition Infant Feeding: No evidence of DVT seen on physical exam. Infant Disposition:home with mother Discharge instruction: per After Visit Summary and Postpartum booklet. Activity: Advance as tolerated. Pelvic rest for 6 weeks.  Diet: routine  diet Future Appointments: Future Appointments  Date Time Provider [place]  06/11/2020 11:10 AM Myrtis Ser, CNM [facility]   Follow up Visit:  Please schedule this patient for a In person postpartum visit in 4 weeks with the following provider: Any provider. Additional Postpartum F/U: None   Low risk pregnancy complicated by:  None Delivery mode:  Vaginal, Spontaneous  Anticipated Birth Control:  Depo   Charlton Haws, MD PGY-1 Family Medicine Resident  Attestation of Supervision of Student:  I confirm that I have verified the information documented in the medical student's note and that I have also personally performed the history, physical exam and all medical decision making activities.  I have verified that all services and findings are accurately documented in this student's note; and I agree with management and plan as outlined in the documentation. I have also made any necessary editorial changes.  Gabriel Carina, [place] for [employer], [place] [employer] 06/06/2020 11:20 AM

## 2020-06-05 NOTE — MAU Note (Signed)
Presents with c/o ctxs since 0500 this morning, reports ctxs are now 405 minutes apart.  Denies VB or LOF, states noticing pink discharge.  Endorses +FM.

## 2020-06-06 DIAGNOSIS — Z9889 Other specified postprocedural states: Secondary | ICD-10-CM

## 2020-06-06 LAB — CBC
HCT: 30.6 % — ABNORMAL LOW (ref 36.0–46.0)
Hemoglobin: 9.9 g/dL — ABNORMAL LOW (ref 12.0–15.0)
MCH: 30 pg (ref 26.0–34.0)
MCHC: 32.4 g/dL (ref 30.0–36.0)
MCV: 92.7 fL (ref 80.0–100.0)
Platelets: 325 10*3/uL (ref 150–400)
RBC: 3.3 MIL/uL — ABNORMAL LOW (ref 3.87–5.11)
RDW: 12.8 % (ref 11.5–15.5)
WBC: 16.7 10*3/uL — ABNORMAL HIGH (ref 4.0–10.5)
nRBC: 0 % (ref 0.0–0.2)

## 2020-06-06 LAB — BIRTH TISSUE RECOVERY COLLECTION (PLACENTA DONATION)

## 2020-06-06 MED ORDER — IBUPROFEN 600 MG PO TABS: 600.0000 mg | ORAL_TABLET | Freq: Four times a day (QID) | ORAL | 0 refills | Status: DC

## 2020-06-06 MED FILL — IBUPROFEN 600 MG TABLET: 600 | 7 days supply | Qty: 30 | Fill #0

## 2020-06-06 NOTE — Discharge Instructions (Signed)
Postpartum Care After Vaginal Delivery This sheet gives you information about how to care for yourself from the time you deliver your baby to up to 6-12 weeks after delivery (postpartum period). Your health care provider may also give you more specific instructions. If you have problems or questions, contact your health care provider. Follow these instructions at home: Vaginal bleeding  It is normal to have vaginal bleeding (lochia) after delivery. Wear a sanitary pad for vaginal bleeding and discharge. ? During the first week after delivery, the amount and appearance of lochia is often similar to a menstrual period. ? Over the next few weeks, it will gradually decrease to a dry, yellow-brown discharge. ? For most women, lochia stops completely by 4-6 weeks after delivery. Vaginal bleeding can vary from woman to woman.  Change your sanitary pads frequently. Watch for any changes in your flow, such as: ? A sudden increase in volume. ? A change in color. ? Large blood clots.  If you pass a blood clot from your vagina, save it and call your health care provider to discuss. Do not flush blood clots down the toilet before talking with your health care provider.  Do not use tampons or douches until your health care provider says this is safe.  If you are not breastfeeding, your period should return 6-8 weeks after delivery. If you are feeding your child breast milk only (exclusive breastfeeding), your period may not return until you stop breastfeeding. Perineal care  Keep the area between the vagina and the anus (perineum) clean and dry as told by your health care provider. Use medicated pads and pain-relieving sprays and creams as directed.  If you had a cut in the perineum (episiotomy) or a tear in the vagina, check the area for signs of infection until you are healed. Check for: ? More redness, swelling, or pain. ? Fluid or blood coming from the cut or tear. ? Warmth. ? Pus or a bad  smell.  You may be given a squirt bottle to use instead of wiping to clean the perineum area after you go to the bathroom. As you start healing, you may use the squirt bottle before wiping yourself. Make sure to wipe gently.  To relieve pain caused by an episiotomy, a tear in the vagina, or swollen veins in the anus (hemorrhoids), try taking a warm sitz bath 2-3 times a day. A sitz bath is a warm water bath that is taken while you are sitting down. The water should only come up to your hips and should cover your buttocks. Breast care  Within the first few days after delivery, your breasts may feel heavy, full, and uncomfortable (breast engorgement). Milk may also leak from your breasts. Your health care provider can suggest ways to help relieve the discomfort. Breast engorgement should go away within a few days.  If you are breastfeeding: ? Wear a bra that supports your breasts and fits you well. ? Keep your nipples clean and dry. Apply creams and ointments as told by your health care provider. ? You may need to use breast pads to absorb milk that leaks from your breasts. ? You may have uterine contractions every time you breastfeed for up to several weeks after delivery. Uterine contractions help your uterus return to its normal size. ? If you have any problems with breastfeeding, work with your health care provider or Science writer.  If you are not breastfeeding: ? Avoid touching your breasts a lot. Doing this can make  your breasts produce more milk. ? Wear a good-fitting bra and use cold packs to help with swelling. ? Do not squeeze out (express) milk. This causes you to make more milk. Intimacy and sexuality  Ask your health care provider when you can engage in sexual activity. This may depend on: ? Your risk of infection. ? How fast you are healing. ? Your comfort and desire to engage in sexual activity.  You are able to get pregnant after delivery, even if you have not had  your period. If desired, talk with your health care provider about methods of birth control (contraception). Medicines  Take over-the-counter and prescription medicines only as told by your health care provider.  If you were prescribed an antibiotic medicine, take it as told by your health care provider. Do not stop taking the antibiotic even if you start to feel better. Activity  Gradually return to your normal activities as told by your health care provider. Ask your health care provider what activities are safe for you.  Rest as much as possible. Try to rest or take a nap while your baby is sleeping. Eating and drinking   Drink enough fluid to keep your urine pale yellow.  Eat high-fiber foods every day. These may help prevent or relieve constipation. High-fiber foods include: ? Whole grain cereals and breads. ? Brown rice. ? Beans. ? Fresh fruits and vegetables.  Do not try to lose weight quickly by cutting back on calories.  Take your prenatal vitamins until your postpartum checkup or until your health care provider tells you it is okay to stop. Lifestyle  Do not use any products that contain nicotine or tobacco, such as cigarettes and e-cigarettes. If you need help quitting, ask your health care provider.  Do not drink alcohol, especially if you are breastfeeding. General instructions  Keep all follow-up visits for you and your baby as told by your health care provider. Most women visit their health care provider for a postpartum checkup within the first 3-6 weeks after delivery. Contact a health care provider if:  You feel unable to cope with the changes that your child brings to your life, and these feelings do not go away.  You feel unusually sad or worried.  Your breasts become red, painful, or hard.  You have a fever.  You have trouble holding urine or keeping urine from leaking.  You have little or no interest in activities you used to enjoy.  You have not  breastfed at all and you have not had a menstrual period for 12 weeks after delivery.  You have stopped breastfeeding and you have not had a menstrual period for 12 weeks after you stopped breastfeeding.  You have questions about caring for yourself or your baby.  You pass a blood clot from your vagina. Get help right away if:  You have chest pain.  You have difficulty breathing.  You have sudden, severe leg pain.  You have severe pain or cramping in your lower abdomen.  You bleed from your vagina so much that you fill more than one sanitary pad in one hour. Bleeding should not be heavier than your heaviest period.  You develop a severe headache.  You faint.  You have blurred vision or spots in your vision.  You have bad-smelling vaginal discharge.  You have thoughts about hurting yourself or your baby. If you ever feel like you may hurt yourself or others, or have thoughts about taking your own life, get help  right away. You can go to the nearest emergency department or call:  Your local emergency services (911 in the [place]  A suicide crisis helpline, such as the [facility] at 724 672 0081. This is open 24 hours a day. Summary  The period of time right after you deliver your newborn up to 6-12 weeks after delivery is called the postpartum period.  Gradually return to your normal activities as told by your health care provider.  Keep all follow-up visits for you and your baby as told by your health care provider. This information is not intended to replace advice given to you by your health care provider. Make sure you discuss any questions you have with your health care provider. Document Revised: 06/24/2017 Document Reviewed: 04/04/2017 Elsevier Patient Education  2020 [employer].  Postpartum Baby Blues The postpartum period begins right after the birth of a baby. During this time, there is often a lot of joy and excitement. It is also a  time of many changes in the life of the parents. No matter how many times a mother gives birth, each child brings new challenges to the family, including different ways of relating to one another. It is common to have feelings of excitement along with confusing changes in moods, emotions, and thoughts. You may feel happy one minute and sad or stressed the next. These feelings of sadness usually happen in the period right after you have your baby, and they go away within a week or two. This is called the "baby blues." What are the causes? There is no known cause of baby blues. It is likely caused by a combination of factors. However, changes in hormone levels after childbirth are believed to trigger some of the symptoms. Other factors that can play a role in these mood changes include:  Lack of sleep.  Stressful life events, such as poverty, caring for a loved one, or death of a loved one.  Genetics. What are the signs or symptoms? Symptoms of this condition include:  Brief changes in mood, such as going from extreme happiness to sadness.  Decreased concentration.  Difficulty sleeping.  Crying spells and tearfulness.  Loss of appetite.  Irritability.  Anxiety. If the symptoms of baby blues last for more than 2 weeks or become more severe, you may have postpartum depression. How is this diagnosed? This condition is diagnosed based on an evaluation of your symptoms. There are no medical or lab tests that lead to a diagnosis, but there are various questionnaires that a health care provider may use to identify women with the baby blues or postpartum depression. How is this treated? Treatment is not needed for this condition. The baby blues usually go away on their own in 1-2 weeks. Social support is often all that is needed. You will be encouraged to get adequate sleep and rest. Follow these instructions at home: Lifestyle      Get as much rest as you can. Take a nap when the baby  sleeps.  Exercise regularly as told by your health care provider. Some women find yoga and walking to be helpful.  Eat a balanced and nourishing diet. This includes plenty of fruits and vegetables, whole grains, and lean proteins.  Do little things that you enjoy. Have a cup of tea, take a bubble bath, read your favorite magazine, or listen to your favorite music.  Avoid alcohol.  Ask for help with household chores, cooking, grocery shopping, or running errands. Do not try to  do everything yourself. Consider hiring a postpartum doula to help. This is a professional who specializes in providing support to new mothers.  Try not to make any major life changes during pregnancy or right after giving birth. This can add stress. General instructions  Talk to people close to you about how you are feeling. Get support from your partner, family members, friends, or other new moms. You may want to join a support group.  Find ways to cope with stress. This may include: ? Writing your thoughts and feelings in a journal. ? Spending time outside. ? Spending time with people who make you laugh.  Try to stay positive in how you think. Think about the things you are grateful for.  Take over-the-counter and prescription medicines only as told by your health care provider.  Let your health care provider know if you have any concerns.  Keep all postpartum visits as told by your health care provider. This is important. Contact a health care provider if:  Your baby blues do not go away after 2 weeks. Get help right away if:  You have thoughts of taking your own life (suicidal thoughts).  You think you may harm the baby or other people.  You see or hear things that are not there (hallucinations). Summary  After giving birth, you may feel happy one minute and sad or stressed the next. Feelings of sadness that happen right after the baby is born and go away after a week or two are called the "baby  blues."  You can manage the baby blues by getting enough rest, eating a healthy diet, exercising, spending time with supportive people, and finding ways to cope with stress.  If feelings of sadness and stress last longer than 2 weeks or get in the way of caring for your baby, talk to your health care provider. This may mean you have postpartum depression. This information is not intended to replace advice given to you by your health care provider. Make sure you discuss any questions you have with your health care provider. Document Revised: 10/13/2018 Document Reviewed: 08/17/2016 Elsevier Patient Education  [place].

## 2020-06-11 ENCOUNTER — Encounter: Payer: 59 | Admitting: Advanced Practice Midwife

## 2020-07-15 ENCOUNTER — Encounter: Payer: Self-pay | Admitting: *Deleted

## 2020-07-16 ENCOUNTER — Ambulatory Visit (INDEPENDENT_AMBULATORY_CARE_PROVIDER_SITE_OTHER): Payer: 59 | Admitting: Advanced Practice Midwife

## 2020-07-16 ENCOUNTER — Other Ambulatory Visit: Payer: Self-pay

## 2020-07-16 ENCOUNTER — Encounter: Payer: Self-pay | Admitting: Advanced Practice Midwife

## 2020-07-16 ENCOUNTER — Other Ambulatory Visit (HOSPITAL_COMMUNITY): Payer: Self-pay | Admitting: Advanced Practice Midwife

## 2020-07-16 DIAGNOSIS — Z8759 Personal history of other complications of pregnancy, childbirth and the puerperium: Secondary | ICD-10-CM

## 2020-07-16 MED ORDER — MEDROXYPROGESTERONE ACETATE 150 MG/ML IM SUSP: 150.0000 mg | INTRAMUSCULAR | 3 refills | Status: DC

## 2020-07-16 MED FILL — medroxyPROGESTERone ACETATE: 150 | 90 days supply | Qty: 1 | Fill #0

## 2020-07-16 NOTE — Progress Notes (Signed)
POSTPARTUM VISIT Patient name: Sara Kirby MRN [id number]  Date of birth: [birth year] Chief Complaint:   Postpartum Care (Started Depo in hosp.)  History of Present Illness:   Sara Kirby is a 30 y.o. G20P2002 Caucasian female being seen today for a postpartum visit. She is 6 weeks postpartum following a spontaneous vaginal delivery at 38.1 gestational weeks. IOL: No. Anesthesia: local.  Laceration: 2nd degree.  Complications: none. Inpatient contraception: yes Depo 06/06/20.   Pregnancy uncomplicated. Tobacco use: no. Substance use disorder: no. Last pap smear: July 2019 and results were normal. Next pap smear due: July 2022 No LMP recorded.  Postpartum course has been uncomplicated. Bleeding thin lochia. Bowel function is normal. Bladder function is normal. Urinary incontinence? No, fecal incontinence? No Patient is not sexually active. Last sexual activity: prior to delivery.  Desired contraception: PP Depo given. Patient does not know about a pregnancy in the future.  Desired family size is unsure children.   Upstream - 07/16/20 1346      Pregnancy Intention Screening   Does the patient want to become pregnant in the next year? No    Does the patient's partner want to become pregnant in the next year? No    Would the patient like to discuss contraceptive options today? No      Contraception Wrap Up   Current Method Hormonal Injection    End Method Hormonal Injection    Contraception Counseling Provided No          The pregnancy intention screening data noted above was reviewed. Potential methods of contraception were discussed. The patient elected to proceed with Hormonal Injection.   Edinburgh Postpartum Depression Screening: negative  Edinburgh Postnatal Depression Scale - 07/16/20 1339      Edinburgh Postnatal Depression Scale:  In the Past 7 Days   I have been able to laugh and see the funny side of things. 0    I have looked forward with enjoyment to things. 0    I  have blamed myself unnecessarily when things went wrong. 0    I have been anxious or worried for no good reason. 0    I have felt scared or panicky for no good reason. 0    Things have been getting on top of me. 1    I have been so unhappy that I have had difficulty sleeping. 0    I have felt sad or miserable. 1    I have been so unhappy that I have been crying. 0    The thought of harming myself has occurred to me. 0    Edinburgh Postnatal Depression Scale Total 2          Baby's course has been uncomplicated. Baby is feeding by breast: milk supply adequate. Infant has a pediatrician/family doctor? Yes.  Childcare strategy if returning to work/school: will be home for at least another month.  Pt has material needs met for her and baby: Yes.   Review of Systems:   Pertinent items are noted in HPI Denies Abnormal vaginal discharge w/ itching/odor/irritation, headaches, visual changes, shortness of breath, chest pain, abdominal pain, severe nausea/vomiting, or problems with urination or bowel movements. Pertinent History Reviewed:  Reviewed past medical,surgical, obstetrical and family history.  Reviewed problem list, medications and allergies. OB History  Gravida Para Term Preterm AB Living  '2 2 2 ' 0 0 2  SAB IAB Ectopic Multiple Live Births  0 0 0 0 2    # Outcome Date  GA Lbr Len/2nd Weight Sex Delivery Anes PTL Lv  2 Term 06/05/20 [id number]:40 7 lb 15.2 oz (3.606 kg) M Vag-Spont None  LIV  1 Term 01/31/17 [id number]:21 / 00:32 8 lb (3.629 kg) M Vag-Vacuum Local N LIV   Physical Assessment:   Vitals:   07/16/20 1343  BP: 121/79  Pulse: 80  Weight: 162 lb (73.5 kg)  Height: '5\' 7"'  (1.702 m)  Body mass index is 25.37 kg/m.       Physical Examination:   General appearance: alert, well appearing, and in no distress  Mental status: alert, oriented to person, place, and time  Skin: warm & dry   Cardiovascular: normal heart rate noted   Respiratory: normal respiratory effort, no  distress   Breasts: deferred, no complaints   Abdomen: soft, non-tender   Pelvic: normal external genitalia, vulva, vagina, cervix, uterus and adnexa- well healed lac  Rectal: no hemorrhoids  Extremities: no edema         No results found for this or any previous visit (from the past 24 hour(s)).  Assessment & Plan:  1) Postpartum exam 2) Six wks s/p spontaneous vaginal delivery 3) breast feeding 4) Depression screening- neg 5) Contraception management- scheduled for next Depo injections in March/June, and pap/phys in July  Essential components of care per ACOG recommendations:  1.  Mood and well being:  . If positive depression screen, discussed and plan developed.  . If using tobacco we discussed reduction/cessation and risk of relapse . If current substance abuse, we discussed and referral to local resources was offered.   2. Infant care and feeding:  . If breastfeeding, discussed returning to work, pumping, breastfeeding-associated pain, guidance regarding return to fertility while lactating if not using another method. If needed, patient was provided with a letter to be allowed to pump q 2-3hrs to support lactation in a private location with access to a refrigerator to store breastmilk.   . Recommended that all caregivers be immunized for flu, pertussis and other preventable communicable diseases . If pt does not have material needs met for her/baby, referred to local resources for help obtaining these.  3. Sexuality, contraception and birth spacing . Provided guidance regarding sexuality, management of dyspareunia, and resumption of intercourse . Discussed avoiding interpregnancy interval <2m24m and recommended birth spacing of 18 months  4. Sleep and fatigue . Discussed coping options for fatigue and sleep disruption . Encouraged family/partner/community support of 4 hrs of uninterrupted sleep to help with mood and fatigue  5. Physical recovery  . If pt had a C/S,  assessed incisional pain and providing guidance on normal vs prolonged recovery . If pt had a laceration, perineal healing and pain reviewed.  . If urinary or fecal incontinence, discussed management and referred to PT or uro/gyn if indicated  . Patient is safe to resume physical activity. Discussed attainment of healthy weight.  6.  Chronic disease management . Discussed pregnancy complications if any, and their implications for future childbearing and long-term maternal health. . Review recommendations for prevention of recurrent pregnancy complications, such as 17 hydroxyprogesterone caproate to reduce risk for recurrent PTB not applicable, or aspirin to reduce risk of preeclampsia not applicable. . Pt had GDM: No. If yes, 2hr GTT scheduled: not applicable. . Reviewed medications and non-pregnant dosing including consideration of whether pt is breastfeeding using a reliable resource such as LactMed: yes . Referred for f/u w/ PCP or subspecialist providers as indicated: not applicable  7. Health maintenance . Mammogram  at 31yo or earlier if indicated . Pap smears as indicated  Meds:  Meds ordered this encounter  Medications  . medroxyPROGESTERone (DEPO-PROVERA) 150 MG/ML injection    Sig: Inject 1 mL (150 mg total) into the muscle every 3 (three) months.    Dispense:  1 mL    Refill:  3    Order Specific Question:   Supervising Provider    Answer:   Florian Buff [[id number]]    Follow-up: Return for Depo around March 3 then June 3; Pap & phys in July.   No orders of the defined types were placed in this encounter.   Myrtis Ser CNM 07/16/2020 2:02 PM

## 2020-07-30 ENCOUNTER — Other Ambulatory Visit: Payer: Self-pay | Admitting: Advanced Practice Midwife

## 2020-07-30 DIAGNOSIS — N3000 Acute cystitis without hematuria: Secondary | ICD-10-CM

## 2020-07-30 MED ORDER — NITROFURANTOIN MONOHYD MACRO 100 MG PO CAPS: 100.0000 mg | ORAL_CAPSULE | Freq: Two times a day (BID) | ORAL | 0 refills | Status: DC

## 2020-07-30 MED FILL — NITROFURANTOIN MONO-MCR 100: 100 | 7 days supply | Qty: 14 | Fill #0

## 2020-08-28 MED FILL — medroxyPROGESTERone ACETATE: 150 | 90 days supply | Qty: 1 | Fill #0

## 2020-09-03 ENCOUNTER — Other Ambulatory Visit: Payer: Self-pay

## 2020-09-03 ENCOUNTER — Ambulatory Visit (INDEPENDENT_AMBULATORY_CARE_PROVIDER_SITE_OTHER): Payer: No Typology Code available for payment source

## 2020-09-03 DIAGNOSIS — Z3042 Encounter for surveillance of injectable contraceptive: Secondary | ICD-10-CM | POA: Diagnosis not present

## 2020-09-03 MED ORDER — MEDROXYPROGESTERONE ACETATE 150 MG/ML IM SUSY
PREFILLED_SYRINGE | Freq: Once | INTRAMUSCULAR | Status: AC
Start: 2020-09-03 — End: 2020-09-03

## 2020-09-03 NOTE — Progress Notes (Signed)
   NURSE VISIT- INJECTION  SUBJECTIVE:  Sara Kirby is a 30 y.o. [phone] female here for a Depo Provera for contraception/period management. She is a GYN patient.   OBJECTIVE:  There were no vitals taken for this visit.  Appears well, in no apparent distress  Injection administered in: Right deltoid  Meds ordered this encounter  Medications  . medroxyPROGESTERone Acetate SUSY    ASSESSMENT: GYN patient Depo Provera for contraception/period management PLAN: Follow-up: in 11-13 weeks for next Depo   Melvena Vink A Reynoldo Mainer  09/03/2020 9:50 AM

## 2020-09-25 ENCOUNTER — Other Ambulatory Visit (HOSPITAL_BASED_OUTPATIENT_CLINIC_OR_DEPARTMENT_OTHER): Payer: Self-pay

## 2020-10-02 ENCOUNTER — Other Ambulatory Visit: Payer: No Typology Code available for payment source

## 2020-10-02 ENCOUNTER — Other Ambulatory Visit (INDEPENDENT_AMBULATORY_CARE_PROVIDER_SITE_OTHER): Payer: No Typology Code available for payment source

## 2020-10-02 ENCOUNTER — Other Ambulatory Visit: Payer: Self-pay

## 2020-10-02 DIAGNOSIS — R350 Frequency of micturition: Secondary | ICD-10-CM

## 2020-10-02 DIAGNOSIS — R300 Dysuria: Secondary | ICD-10-CM | POA: Diagnosis not present

## 2020-10-02 LAB — POCT URINALYSIS DIPSTICK
Blood, UA: NEGATIVE
Glucose, UA: NEGATIVE
Ketones, UA: NEGATIVE
Leukocytes, UA: NEGATIVE
Nitrite, UA: NEGATIVE
Protein, UA: NEGATIVE

## 2020-10-02 NOTE — Progress Notes (Addendum)
   NURSE VISIT- UTI SYMPTOMS   SUBJECTIVE:  Sara Kirby is a 30 y.o. [phone] female here for UTI symptoms. She is a GYN patient. She reports dysuria, urinary frequency and urinary urgency.  OBJECTIVE:  Breastfeeding Yes   Appears well, in no apparent distress  Results for orders placed or performed in visit on 10/02/20 (from the past 24 hour(s))  POCT Urinalysis Dipstick   Collection Time: 10/02/20  3:58 PM  Result Value Ref Range   Color, UA     Clarity, UA     Glucose, UA Negative Negative   Bilirubin, UA     Ketones, UA neg    Spec Grav, UA     Blood, UA neg    pH, UA     Protein, UA Negative Negative   Urobilinogen, UA     Nitrite, UA neg    Leukocytes, UA Negative Negative   Appearance     Odor      ASSESSMENT: GYN patient with UTI symptoms and negative nitrites  PLAN: Discussed with Knute Neu, CNM, [facility]   Rx sent by provider today: Yes Urine culture sent Call or return to clinic prn if these symptoms worsen or fail to improve as anticipated. Follow-up: as needed   Alice Rieger  10/02/2020 3:59 PM   Chart reviewed for nurse visit. Agree with plan of care. Rx macrobid Roma Schanz, [place] 10/03/2020 8:52 AM

## 2020-10-03 ENCOUNTER — Other Ambulatory Visit: Payer: Self-pay | Admitting: Women's Health

## 2020-10-03 MED ORDER — NITROFURANTOIN MONOHYD MACRO 100 MG PO CAPS: 100.0000 mg | ORAL_CAPSULE | Freq: Two times a day (BID) | ORAL | 0 refills | Status: DC

## 2020-10-03 MED FILL — NITROFURANTOIN MONO-MCR 100: 100 | 7 days supply | Qty: 14 | Fill #0

## 2020-10-03 NOTE — Addendum Note (Signed)
Addended by: Roma Schanz on: 10/03/2020 08:52 AM   Modules accepted: Orders

## 2020-10-04 ENCOUNTER — Other Ambulatory Visit (HOSPITAL_COMMUNITY): Payer: Self-pay

## 2020-10-05 LAB — URINE CULTURE: Organism ID, Bacteria: NO GROWTH

## 2020-12-02 ENCOUNTER — Ambulatory Visit: Payer: Self-pay

## 2021-01-07 ENCOUNTER — Other Ambulatory Visit: Payer: Self-pay

## 2021-01-07 ENCOUNTER — Other Ambulatory Visit (HOSPITAL_COMMUNITY)
Admission: RE | Admit: 2021-01-07 | Discharge: 2021-01-07 | Disposition: A | Discharge status: Home / Self Care | Payer: No Typology Code available for payment source | Source: Ambulatory Visit | Attending: Advanced Practice Midwife | Admitting: Advanced Practice Midwife

## 2021-01-07 ENCOUNTER — Ambulatory Visit (INDEPENDENT_AMBULATORY_CARE_PROVIDER_SITE_OTHER): Payer: No Typology Code available for payment source | Admitting: Advanced Practice Midwife

## 2021-01-07 ENCOUNTER — Encounter: Payer: Self-pay | Admitting: Advanced Practice Midwife

## 2021-01-07 VITALS — BP 118/73 | HR 80 | Ht 67.0 in | Wt 144.0 lb

## 2021-01-07 DIAGNOSIS — Z01419 Encounter for gynecological examination (general) (routine) without abnormal findings: Secondary | ICD-10-CM | POA: Diagnosis not present

## 2021-01-07 NOTE — Progress Notes (Signed)
WELL-WOMAN EXAMINATION Patient name: Sara Kirby MRN [id number]  Date of birth: [birth year] Chief Complaint:   Gynecologic Exam  History of Present Illness:   Sara Kirby is a 30 y.o. G60P2002 Caucasian female being seen today for a routine well-woman exam.  Current complaints: has occ constipation/sm bleeding from fissure; still breastfeeding; not getting much sleep with 55 month old/30 yr old; condoms for contraception; feels irritable (never had dx of depression/anxiety)  PCP: none      does not desire labs No LMP recorded (lmp unknown). (Menstrual status: Lactating). The current method of family planning is condoms.  Last pap July 2019. Results were: NILM w/ HRHPV not done. H/O abnormal pap: no Last mammogram: never. Family h/o breast cancer: no Last colonoscopy: never. Family h/o colorectal cancer: no  Depression screen [facility] 2/9 01/07/2021 03/26/2020 12/05/2019 07/07/2016 06/10/2016  Decreased Interest 0 0 0 0 0  Down, Depressed, Hopeless 0 0 0 0 0  PHQ - 2 Score 0 0 0 0 0  Altered sleeping 0 0 0 0 -  Tired, decreased energy 2 2 1 1  -  Change in appetite 0 0 0 0 -  Feeling bad or failure about yourself  0 0 0 0 -  Trouble concentrating 0 0 0 0 -  Moving slowly or fidgety/restless 0 0 0 0 -  Suicidal thoughts 0 0 0 0 -  PHQ-9 Score 2 2 1 1  -  Difficult doing work/chores - - Not difficult at all - -  Some encounter information is confidential and restricted. Go to Review Flowsheets activity to see all data.     GAD 7 : Generalized Anxiety Score 01/07/2021 03/26/2020 12/05/2019  Nervous, Anxious, on Edge 0 0 0  Control/stop worrying 0 0 0  Worry too much - different things 1 0 0  Trouble relaxing 1 0 0  Restless 0 0 0  Easily annoyed or irritable 1 1 0  Afraid - awful might happen 0 0 0  Total GAD 7 Score 3 1 0  Anxiety Difficulty - - Not difficult at all     Review of Systems:   Pertinent items are noted in HPI Denies any headaches, blurred vision, fatigue, shortness of breath,  chest pain, abdominal pain, abnormal vaginal discharge/itching/odor/irritation, problems with periods, bowel movements, urination, or intercourse unless otherwise stated above. Pertinent History Reviewed:  Reviewed past medical,surgical, social and family history.  Reviewed problem list, medications and allergies. Physical Assessment:   Vitals:   01/07/21 1046  BP: 118/73  Pulse: 80  Weight: 144 lb (65.3 kg)  Height: 5\' 7"  (1.702 m)  Body mass index is 22.55 kg/m.        Physical Examination:   General appearance - well appearing, and in no distress  Mental status - alert, oriented to person, place, and time  Psych:  She has a normal mood and affect  Skin - warm and dry, normal color, no suspicious lesions noted  Chest - effort normal, all lung fields clear to auscultation bilaterally  Heart - normal rate and regular rhythm  Neck:  midline trachea, no thyromegaly or nodules  Breasts - breasts appear normal, no suspicious masses, no skin or nipple changes or  axillary nodes  Abdomen - soft, nontender, nondistended, no masses or organomegaly  Pelvic - VULVA: normal appearing vulva with no masses, tenderness or lesions  VAGINA: normal appearing vagina with normal color and discharge, no lesions  CERVIX: normal appearing cervix without discharge or lesions, no CMT  Thin prep pap is done with HR HPV cotesting  UTERUS: uterus is felt to be normal size, shape, consistency and nontender   ADNEXA: No adnexal masses or tenderness noted.  Extremities:  No swelling or varicosities noted  Chaperone: Angel Neas    No results found for this or any previous visit (from the past 24 hour(s)).  Assessment & Plan:  1) Well-Woman Exam  2) Anxious/irritable (neg PHQ9/GAD7), discussed could be due to lack of sleep with young children; will let me know if she desires a referral to [facility]  3) Rectal fissure, encouraged stool softener use  Labs/procedures today: Thin prep pap; GC/chlam  Mammogram: @  31yo, or sooner if problems Colonoscopy: @ 31yo, or sooner if problems  No orders of the defined types were placed in this encounter.   Meds: No orders of the defined types were placed in this encounter.   Follow-up: Return in about 1 year (around 01/07/2022) for Physical.  Myrtis Ser CNM 01/07/2021 11:25 AM

## 2021-01-12 LAB — CYTOLOGY - PAP
Comment: NEGATIVE
Diagnosis: NEGATIVE
High risk HPV: NEGATIVE

## 2021-05-06 ENCOUNTER — Telehealth: Payer: Self-pay | Admitting: Advanced Practice Midwife

## 2021-05-06 NOTE — Telephone Encounter (Signed)
Patient is restarting depo on the 7th, will need a refill.

## 2021-05-07 ENCOUNTER — Other Ambulatory Visit: Payer: Self-pay | Admitting: Adult Health

## 2021-05-07 MED ORDER — MEDROXYPROGESTERONE ACETATE 150 MG/ML IM SUSP: 150.0000 mg | INTRAMUSCULAR | 4 refills | Status: DC

## 2021-05-07 NOTE — Progress Notes (Signed)
Will rx depo

## 2021-05-11 ENCOUNTER — Other Ambulatory Visit (INDEPENDENT_AMBULATORY_CARE_PROVIDER_SITE_OTHER): Payer: No Typology Code available for payment source | Admitting: *Deleted

## 2021-05-11 ENCOUNTER — Other Ambulatory Visit: Payer: Self-pay

## 2021-05-11 DIAGNOSIS — Z30013 Encounter for initial prescription of injectable contraceptive: Secondary | ICD-10-CM

## 2021-05-11 DIAGNOSIS — Z3202 Encounter for pregnancy test, result negative: Secondary | ICD-10-CM

## 2021-05-11 LAB — POCT URINE PREGNANCY: Preg Test, Ur: NEGATIVE

## 2021-05-11 MED ORDER — MEDROXYPROGESTERONE ACETATE 150 MG/ML IM SUSP
150.0000 mg | Freq: Once | INTRAMUSCULAR | Status: AC
  Administered 2021-05-11: 150 mg via INTRAMUSCULAR

## 2021-05-11 NOTE — Progress Notes (Signed)
   NURSE VISIT- INJECTION  SUBJECTIVE:  Sara Kirby is a 30 y.o. [phone] female here for a Depo Provera for contraception/period management. She is a GYN patient.   OBJECTIVE:  LMP 05/10/2021   Breastfeeding Yes   Appears well, in no apparent distress  Injection administered in: Left deltoid  Meds ordered this encounter  Medications   medroxyPROGESTERone (DEPO-PROVERA) injection 150 mg    ASSESSMENT: GYN patient Depo Provera for contraception/period management. UPT is negative in office. PLAN: Follow-up: in 11-13 weeks for next Depo   Levy Pupa  05/11/2021 11:23 AM

## 2021-07-13 ENCOUNTER — Other Ambulatory Visit: Payer: Self-pay

## 2021-07-13 ENCOUNTER — Ambulatory Visit: Payer: No Typology Code available for payment source | Admitting: Adult Health

## 2021-07-13 ENCOUNTER — Encounter: Payer: Self-pay | Admitting: Adult Health

## 2021-07-13 VITALS — BP 122/80 | HR 83 | Ht 67.0 in | Wt 129.5 lb

## 2021-07-13 DIAGNOSIS — F32A Depression, unspecified: Secondary | ICD-10-CM | POA: Diagnosis not present

## 2021-07-13 DIAGNOSIS — F419 Anxiety disorder, unspecified: Secondary | ICD-10-CM | POA: Diagnosis not present

## 2021-07-13 DIAGNOSIS — N926 Irregular menstruation, unspecified: Secondary | ICD-10-CM | POA: Diagnosis not present

## 2021-07-13 DIAGNOSIS — Z3202 Encounter for pregnancy test, result negative: Secondary | ICD-10-CM

## 2021-07-13 LAB — POCT URINE PREGNANCY: Preg Test, Ur: NEGATIVE

## 2021-07-13 MED ORDER — SERTRALINE HCL 50 MG PO TABS: 50.0000 mg | ORAL_TABLET | Freq: Every day | ORAL | 3 refills | Status: DC

## 2021-07-13 NOTE — Progress Notes (Signed)
Subjective:     Patient ID: Sara Kirby, female   DOB: [birth year], 31 y.o.   MRN: [id number]  HPI Sara Kirby is a 31 years old [place] female,married, G2P2 in complaining of bleeding on depo, which she received 05/11/21 and is more emotional and anxious.  She has noticed more dryness and decreased libido. Lab Results  Component Value Date   DIAGPAP  01/07/2021    - Negative for intraepithelial lesion or malignancy (NILM)   [place] Negative 01/07/2021    Review of Systems +bleeding on depo +anxiety more emotional    More vaginal dryness Decreased libido Reviewed past medical,surgical, social and family history. Reviewed medications and allergies.  Objective:   Physical Exam BP 122/80 (BP Location: Left Arm, Patient Position: Sitting, Cuff Size: Normal)    Pulse 83    Ht 5\' 7"  (1.702 m)    Wt 129 lb 8 oz (58.7 kg)    Breastfeeding No    BMI 20.28 kg/m  UPT is negative.   Skin warm and dry.Pelvic: external genitalia is normal in appearance no lesions, vagina: scant discharge without odor,urethra has no lesions or masses noted, cervix:smooth and bulbous, uterus: normal size, shape and contour, non tender, no masses felt, adnexa: no masses or tenderness noted. Bladder is non tender and no masses felt.  Fall risk is low Depression screen [facility] 2/9 07/13/2021 01/07/2021 03/26/2020  Decreased Interest 0 0 0  Down, Depressed, Hopeless 2 0 0  PHQ - 2 Score 2 0 0  Altered sleeping 0 0 0  Tired, decreased energy 1 2 2   Change in appetite 1 0 0  Feeling bad or failure about yourself  1 0 0  Trouble concentrating 2 0 0  Moving slowly or fidgety/restless 1 0 0  Suicidal thoughts 0 0 0  PHQ-9 Score 8 2 2   Difficult doing work/chores - - -  Some encounter information is confidential and restricted. Go to Review Flowsheets activity to see all data.    GAD 7 : Generalized Anxiety Score 07/13/2021 01/07/2021 03/26/2020 12/05/2019  Nervous, Anxious, on Edge 3 0 0 0  Control/stop worrying 3 0 0 0  Worry too much -  different things 3 1 0 0  Trouble relaxing 2 1 0 0  Restless 1 0 0 0  Easily annoyed or irritable 1 1 1  0  Afraid - awful might happen 1 0 0 0  Total GAD 7 Score 14 3 1  0  Anxiety Difficulty - - - Not difficult at all      Upstream - 07/13/21 1621       Pregnancy Intention Screening   Does the patient want to become pregnant in the next year? No    Does the patient's partner want to become pregnant in the next year? No    Would the patient like to discuss contraceptive options today? No      Contraception Wrap Up   Current Method Hormonal Injection    End Method Female Condom            Examination chaperoned by Levy Pupa LPN  Assessment:     1. Pregnancy examination or test, negative result   2. Irregular bleeding She is not getting depo when due  3. Anxiety and depression Will try zoloft Meds ordered this encounter  Medications   sertraline (ZOLOFT) 50 MG tablet    Sig: Take 1 tablet (50 mg total) by mouth daily.    Dispense:  30 tablet    Refill:  3    Order Specific Question:   Supervising Provider    Answer:   Florian Buff [[id number]]   Take time for self     Plan:     Follow up with me in 8 weeks, can be tele visit

## 2021-07-14 ENCOUNTER — Other Ambulatory Visit: Payer: Self-pay | Admitting: Adult Health

## 2021-07-14 MED ORDER — MEGESTROL ACETATE 40 MG PO TABS: ORAL_TABLET | ORAL | 0 refills | Status: DC

## 2021-07-14 NOTE — Progress Notes (Signed)
Will rx megace to stop bleeding

## 2021-08-03 ENCOUNTER — Ambulatory Visit: Payer: No Typology Code available for payment source

## 2021-08-05 ENCOUNTER — Other Ambulatory Visit: Payer: Self-pay | Admitting: Adult Health

## 2021-08-06 ENCOUNTER — Other Ambulatory Visit: Payer: Self-pay | Admitting: Adult Health

## 2021-08-07 ENCOUNTER — Other Ambulatory Visit (HOSPITAL_COMMUNITY): Payer: Self-pay

## 2021-08-08 ENCOUNTER — Other Ambulatory Visit (HOSPITAL_COMMUNITY): Payer: Self-pay

## 2021-08-08 MED ORDER — MEDROXYPROGESTERONE ACETATE 150 MG/ML IM SUSY: PREFILLED_SYRINGE | INTRAMUSCULAR | 4 refills | Status: DC

## 2021-08-08 MED FILL — Megestrol Acetate Tab 40 MG: ORAL | 30 days supply | Qty: 45 | Fill #0 | Status: AC

## 2021-08-08 MED FILL — Sertraline HCl Tab 50 MG: ORAL | 90 days supply | Qty: 90 | Fill #0 | Status: AC

## 2021-09-07 ENCOUNTER — Telehealth: Payer: No Typology Code available for payment source | Admitting: Adult Health

## 2021-09-07 ENCOUNTER — Encounter: Payer: Self-pay | Admitting: Adult Health

## 2021-09-07 DIAGNOSIS — F32A Depression, unspecified: Secondary | ICD-10-CM

## 2021-09-07 DIAGNOSIS — F419 Anxiety disorder, unspecified: Secondary | ICD-10-CM | POA: Diagnosis not present

## 2021-09-07 NOTE — Progress Notes (Signed)
Patient ID: Sara Kirby, female   DOB: 21-Oct-1990, 31 y.o.   MRN: [id number] ? ? ?TELEHEALTH GYNECOLOGY VISIT ENCOUNTER NOTE ? ?Provider location: [facility] [employer] [facility]  ? ?Patient location: Home ? ?I connected with Sara Kirby on 09/07/21 at  9:30 AM EST by telephone and verified that I am speaking with the correct person using two identifiers. Patient was unable to do MyChart audiovisual encounter due to technical difficulties, she tried several times.  ?  ?I discussed the limitations, risks, security and privacy concerns of performing an evaluation and management service by telephone and the availability of in person appointments. I also discussed with the patient that there may be a patient responsible charge related to this service. The patient expressed understanding and agreed to proceed. ?  ?History:  ?Sara Kirby is a 31 y.o. [phone] female being evaluated today for anxiety and depression, is taking zoloft and feels much better.. She denies any abnormal vaginal discharge,or other concerns.   ? has had some cramping. ?  ?Past Medical History:  ?Diagnosis Date  ? Contraceptive management 04/02/2013  ? Patient desires pregnancy 01/09/2016  ? UTI (urinary tract infection) 02/27/2013  ? ?Past Surgical History:  ?Procedure Laterality Date  ? URETHRAL DILATION    ? WISDOM TOOTH EXTRACTION    ? ?The following portions of the patient's history were reviewed and updated as appropriate: allergies, current medications, past family history, past medical history, past social history, past surgical history and problem list.  ? ?Health Maintenance:  ?Lab Results  ?Component Value Date  ? DIAGPAP  01/07/2021  ?  - Negative for intraepithelial lesion or malignancy (NILM)  ? [place] Negative 01/07/2021  ?  ? ?Review of Systems:  ?Pertinent items noted in HPI and remainder of comprehensive ROS otherwise negative. ? ?Physical Exam:  ? ?General:  Alert, oriented and cooperative.   ?Mental Status: Normal  mood and affect perceived. Normal judgment and thought content.  ?Physical exam deferred due to nature of the encounter ?Depression screen [facility] 2/9 09/07/2021 07/13/2021 01/07/2021  ?Decreased Interest 0 0 0  ?Down, Depressed, Hopeless 0 2 0  ?PHQ - 2 Score 0 2 0  ?Altered sleeping 0 0 0  ?Tired, decreased energy 0 1 2  ?Change in appetite 0 1 0  ?Feeling bad or failure about yourself  0 1 0  ?Trouble concentrating 0 2 0  ?Moving slowly or fidgety/restless 0 1 0  ?Suicidal thoughts 0 0 0  ?PHQ-9 Score 0 8 2  ?Difficult doing work/chores - - -  ?Some encounter information is confidential and restricted. Go to Review Flowsheets activity to see all data.  ?  ?GAD 7 : Generalized Anxiety Score 09/07/2021 07/13/2021 01/07/2021 03/26/2020  ?Nervous, Anxious, on Edge 1 3 0 0  ?Control/stop worrying 0 3 0 0  ?Worry too much - different things '1 3 1 '$ 0  ?Trouble relaxing 0 2 1 0  ?Restless 0 1 0 0  ?Easily annoyed or irritable 0 '1 1 1  '$ ?Afraid - awful might happen 0 1 0 0  ?Total GAD 7 Score '2 14 3 1  '$ ?Anxiety Difficulty - - - -  ? ? Upstream - 09/07/21 1001   ? ?  ? Pregnancy Intention Screening  ? Does the patient want to become pregnant in the next year? No   ? Does the patient's partner want to become pregnant in the next year? No   ? Would the patient like to discuss  contraceptive options today? No   ?  ? Contraception Wrap Up  ? Current Method Female Condom   ? End Method Female Condom   ? Contraception Counseling Provided No   ? ?  ?  ? ?  ?  ?  ?Labs and Imaging ?No results found for this or any previous visit (from the past 336 hour(s)). ?No results found.    ?Assessment and Plan:  ?   ?1. Anxiety and depression ?Continue zoloft 50 mg 1 daily, has refills ?Follow up in 4 months for physical and ROS  ? ?   ?  ?I discussed the assessment and treatment plan with the patient. The patient was provided an opportunity to ask questions and all were answered. The patient agreed with the plan and demonstrated an understanding of the  instructions. ?  ?The patient was advised to call back or seek an in-person evaluation/go to the ED if the symptoms worsen or if the condition fails to improve as anticipated. ? ?I provided 10  minutes of non-face-to-face time during this encounter. ?I was in my office at [facility] during this encounter  ? ?Derrek Monaco, NP ?[facility] [place]  ?

## 2021-09-26 ENCOUNTER — Telehealth: Payer: No Typology Code available for payment source | Admitting: Family Medicine

## 2021-09-26 DIAGNOSIS — J019 Acute sinusitis, unspecified: Secondary | ICD-10-CM

## 2021-09-26 MED ORDER — AMOXICILLIN-POT CLAVULANATE 875-125 MG PO TABS: 1.0000 | ORAL_TABLET | Freq: Two times a day (BID) | ORAL | 0 refills | Status: DC

## 2021-09-26 NOTE — Progress Notes (Signed)

## 2021-11-20 ENCOUNTER — Other Ambulatory Visit (HOSPITAL_COMMUNITY): Payer: Self-pay

## 2021-11-20 MED FILL — Sertraline HCl Tab 50 MG: ORAL | 90 days supply | Qty: 90 | Fill #1 | Status: AC

## 2022-01-07 ENCOUNTER — Other Ambulatory Visit: Payer: No Typology Code available for payment source | Admitting: Adult Health

## 2022-02-09 ENCOUNTER — Encounter: Payer: Self-pay | Admitting: Adult Health

## 2022-02-09 ENCOUNTER — Ambulatory Visit (INDEPENDENT_AMBULATORY_CARE_PROVIDER_SITE_OTHER): Payer: No Typology Code available for payment source | Admitting: Adult Health

## 2022-02-09 VITALS — BP 116/71 | HR 75 | Ht 67.0 in | Wt 149.0 lb

## 2022-02-09 DIAGNOSIS — Z01419 Encounter for gynecological examination (general) (routine) without abnormal findings: Secondary | ICD-10-CM | POA: Diagnosis not present

## 2022-02-09 DIAGNOSIS — R448 Other symptoms and signs involving general sensations and perceptions: Secondary | ICD-10-CM | POA: Diagnosis not present

## 2022-02-09 DIAGNOSIS — R5383 Other fatigue: Secondary | ICD-10-CM

## 2022-02-09 NOTE — Progress Notes (Signed)
Patient ID: Sara Kirby, female   DOB: [birth year], 31 y.o.   MRN: [id number] History of Present Illness: Sara Kirby is a 31 year old Sara Kirby female, married, G2P2 in for a well woman gyn exam. She feels cold and tired. She had labs recently done for insurance and was fine, but not sure if thyroid was checked. She stopped Zoloft.  Lab Results  Component Value Date   DIAGPAP  01/07/2021    - Negative for intraepithelial lesion or malignancy (NILM)   [place] Negative 01/07/2021    Current Medications, Allergies, Past Medical History, Past Surgical History, Family History and Social History were reviewed in [employer] record.     Review of Systems: Patient denies any headaches, hearing loss,  blurred vision, shortness of breath, chest pain, abdominal pain, problems with bowel movements, urination, or intercourse. No joint pain or mood swings.  +feels cold and tired Periods can be heavy Works nights     Physical Exam:BP 116/71 (BP Location: Right Arm, Patient Position: Sitting, Cuff Size: Normal)   Pulse 75   Ht '5\' 7"'$  (1.702 m)   Wt 149 lb (67.6 kg)   LMP 01/27/2022   Breastfeeding No   BMI 23.34 kg/m   General:  Well developed, well nourished, no acute distress Skin:  Warm and dry Neck:  Midline trachea, normal thyroid, good ROM, no lymphadenopathy Lungs; Clear to auscultation bilaterally Breast:  No dominant palpable mass, retraction, or nipple discharge Cardiovascular: Regular rate and rhythm Abdomen:  Soft, non tender, no hepatosplenomegaly Pelvic:  External genitalia is normal in appearance, no lesions.  The vagina is normal in appearance. Urethra has no lesions or masses. The cervix is bulbous.  Uterus is felt to be normal size, shape, and contour.  No adnexal masses or tenderness noted.Bladder is non tender, no masses felt. Extremities/musculoskeletal:  No swelling or varicosities noted, no clubbing or cyanosis Psych:  No mood changes, alert and  cooperative,seems happy AA is 3    02/09/2022    2:32 PM 09/07/2021    9:57 AM 07/13/2021    4:23 PM  Depression screen PHQ 2/9  Decreased Interest 0 0 0  Down, Depressed, Hopeless 0 0 2  PHQ - 2 Score 0 0 2  Altered sleeping 0 0 0  Tired, decreased energy 1 0 1  Change in appetite 0 0 1  Feeling bad or failure about yourself  0 0 1  Trouble concentrating 0 0 2  Moving slowly or fidgety/restless 0 0 1  Suicidal thoughts 0 0 0  PHQ-9 Score 1 0 8       02/09/2022    2:32 PM 09/07/2021    9:58 AM 07/13/2021    4:24 PM 01/07/2021   10:51 AM  GAD 7 : Generalized Anxiety Score  Nervous, Anxious, on Edge 0 1 3 0  Control/stop worrying 0 0 3 0  Worry too much - different things 0 '1 3 1  '$ Trouble relaxing 0 0 2 1  Restless 0 0 1 0  Easily annoyed or irritable 0 0 1 1  Afraid - awful might happen 0 0 1 0  Total GAD 7 Score 0 '2 14 3      '$ Upstream - 02/09/22 1432       Pregnancy Intention Screening   Does the patient want to become pregnant in the next year? No    Does the patient's partner want to become pregnant in the next year? No    Would the patient  like to discuss contraceptive options today? No      Contraception Wrap Up   Current Method Female Condom    End Method Female Condom    Contraception Counseling Provided No            Examination chaperoned by Celene Squibb LPN  Impression and Plan: 1. Encounter for well woman exam with routine gynecological exam Pap in 2025 Physical in 1 year  2. Tired Will check TSH  - TSH  3. Feels cold Will check TSH  - TSH   Will talk when labs back

## 2022-02-10 LAB — TSH: TSH: 3.04 u[IU]/mL (ref 0.450–4.500)

## 2022-11-02 NOTE — Progress Notes (Unsigned)
     Established patient visit   Patient: Sara Kirby   DOB: 1991/02/21   32 y.o. Female  MRN: [id number] Visit Date: 11/03/2022  Today's healthcare provider: Charlton Amor, DO   No chief complaint on file.   SUBJECTIVE   No chief complaint on file.  HPI  Pt presents to establish care.  Review of Systems     No outpatient medications have been marked as taking for the 11/03/22 encounter (Appointment) with Charlton Amor, DO.    OBJECTIVE    There were no vitals taken for this visit.  Physical Exam   {Show previous labs (optional):23736}    ASSESSMENT & PLAN    Problem List Items Addressed This Visit   None   No follow-ups on file.      No orders of the defined types were placed in this encounter.   No orders of the defined types were placed in this encounter.    Charlton Amor, DO  [facility] Medicine at [facility] [phone] (phone) [phone] (fax)  [facility]

## 2022-11-03 ENCOUNTER — Encounter: Payer: Self-pay | Admitting: Family Medicine

## 2022-11-03 ENCOUNTER — Ambulatory Visit: Payer: 59 | Admitting: Family Medicine

## 2022-11-03 VITALS — BP 116/71 | HR 81 | Ht 67.0 in | Wt 153.5 lb

## 2022-11-03 DIAGNOSIS — Z0000 Encounter for general adult medical examination without abnormal findings: Secondary | ICD-10-CM | POA: Diagnosis not present

## 2022-11-03 LAB — CBC
HCT: 40.8 % (ref 35.0–45.0)
MCV: 91.7 fL (ref 80.0–100.0)
WBC: 4.7 10*3/uL (ref 3.8–10.8)

## 2022-11-03 NOTE — Assessment & Plan Note (Signed)
-   pt doing well from health maintenance standpoint. Due for pap in 2025 with gyn  - will obtain labs today - discussed increasing exercise and diet improvement

## 2022-11-04 LAB — LIPID PANEL
Cholesterol: 205 mg/dL — ABNORMAL HIGH (ref <200)
HDL: 74 mg/dL (ref >=50)
LDL Cholesterol (Calc): 119 mg/dL (calc) — ABNORMAL HIGH
Non-HDL Cholesterol (Calc): 131 mg/dL (calc) — ABNORMAL HIGH (ref <130)
Total CHOL/HDL Ratio: 2.8 (calc) (ref <5.0)
Triglycerides: 40 mg/dL (ref <150)

## 2022-11-04 LAB — CBC
Hemoglobin: 13.8 g/dL (ref 11.7–15.5)
MCH: 31 pg (ref 27.0–33.0)
MCHC: 33.8 g/dL (ref 32.0–36.0)
MPV: 10.3 fL (ref 7.5–12.5)
Platelets: 291 10*3/uL (ref 140–400)
RBC: 4.45 10*6/uL (ref 3.80–5.10)
RDW: 11.8 % (ref 11.0–15.0)

## 2022-11-04 LAB — BASIC METABOLIC PANEL WITH GFR
BUN: 15 mg/dL (ref 7–25)
CO2: 27 mmol/L (ref 20–32)
Calcium: 9.8 mg/dL (ref 8.6–10.2)
Chloride: 106 mmol/L (ref 98–110)
Creat: 0.79 mg/dL (ref 0.50–0.97)
Glucose, Bld: 99 mg/dL (ref 65–99)
Potassium: 4.3 mmol/L (ref 3.5–5.3)
Sodium: 141 mmol/L (ref 135–146)
eGFR: 102 mL/min/{1.73_m2} (ref >=60)

## 2023-05-15 ENCOUNTER — Encounter: Payer: Self-pay | Admitting: Family Medicine

## 2023-05-20 NOTE — Telephone Encounter (Signed)
I spoke with patient's mom and she is going to try and get the assessment of the ADHD.

## 2023-11-07 ENCOUNTER — Encounter: Payer: 59 | Admitting: Family Medicine

## 2023-11-10 ENCOUNTER — Encounter: Payer: Self-pay | Admitting: Family Medicine

## 2023-11-29 ENCOUNTER — Encounter: Payer: 59 | Admitting: Family Medicine

## 2023-12-06 ENCOUNTER — Encounter: Payer: Self-pay | Admitting: Medical-Surgical

## 2023-12-06 ENCOUNTER — Ambulatory Visit: Admitting: Medical-Surgical

## 2023-12-06 VITALS — BP 110/76 | HR 103 | Resp 20 | Ht 67.0 in | Wt 148.7 lb

## 2023-12-06 DIAGNOSIS — R21 Rash and other nonspecific skin eruption: Secondary | ICD-10-CM

## 2023-12-06 DIAGNOSIS — M25531 Pain in right wrist: Secondary | ICD-10-CM

## 2023-12-06 DIAGNOSIS — W57XXXA Bitten or stung by nonvenomous insect and other nonvenomous arthropods, initial encounter: Secondary | ICD-10-CM | POA: Diagnosis not present

## 2023-12-06 DIAGNOSIS — M25532 Pain in left wrist: Secondary | ICD-10-CM

## 2023-12-06 NOTE — Progress Notes (Signed)
        Established patient visit  History, exam, impression, and plan:  1. Bug bite, initial encounter (Primary) 2. Rash 3. Pain of both wrist joints Pleasant 33 year old female presenting today with complaints of a rash that developed over the weekend.  Notes that on Sunday she went to work and part with her her shift she noted that the inner arms from the wrist up to the axillary region were blotchy and erythematous.  The area was not itchy.  Has had an episode of feeling flushed in her cheeks and ears but denies fever or chills.  Also notes that she has had several fingers on each hand turn [place] and cold to touch.  Notes that she did not feel the best on Sunday.  Yesterday morning, she awoke with her right wrist hurting and woke today with her left wrist hurting.  Her fingers feel like they were swollen and the joints feel uncomfortable and tight.  Notes that she was bitten by a bug on the lateral right upper back near the bra line about a month ago.  Unsure what may have bitten her however the area was greater than a sore dollar size, red, itchy, and swollen.  She has been using hydrocortisone cream on that which has helped to resolve the issue.  Wanted to come in and get checked out to see what might be going on.  Today the blotchiness of her arms has improved but is still visible and slightly pink.  Her hands are normal color with normal temperature and brisk capillary refill.  Radial pulses 2+ bilaterally.  After discussion, she notes that her bilateral knees are a bit uncomfortable as well as her ankles.  The ankles hurt earlier this morning however that has resolved since.  She has not taken anything for her symptoms.  Unclear etiology.  Concern for possible blood-borne illness.  Checking labs as noted below.  Consider Lyme disease, rheumatoid arthritis, or even Chikugunya viral infection.  For now, recommend monitoring symptoms and if these worsen, reach out to us  for further instruction.   Patient verbalized understanding and is agreeable to the plan. - Babesia microti Antibody Panel - CBC with Differential/Platelet - CMP14+EGFR - Chikungunya Antibody IgG/IgM - Sed Rate (ESR) - C-reactive protein - Rheumatoid Factor - ANA  Procedures performed this visit: None.  Return if symptoms worsen or fail to improve.  __________________________________ Maryl Snook, DNP, APRN, FNP-BC Primary Care and Sports Medicine [facility]

## 2023-12-07 ENCOUNTER — Ambulatory Visit: Payer: Self-pay | Admitting: Medical-Surgical

## 2023-12-07 DIAGNOSIS — W57XXXA Bitten or stung by nonvenomous insect and other nonvenomous arthropods, initial encounter: Secondary | ICD-10-CM

## 2023-12-07 DIAGNOSIS — M25531 Pain in right wrist: Secondary | ICD-10-CM

## 2023-12-07 DIAGNOSIS — R21 Rash and other nonspecific skin eruption: Secondary | ICD-10-CM

## 2023-12-07 LAB — CMP14+EGFR
ALT: 25 IU/L (ref 0–32)
AST: 25 IU/L (ref 0–40)
Albumin: 4.6 g/dL (ref 3.9–4.9)
Alkaline Phosphatase: 65 IU/L (ref 44–121)
BUN/Creatinine Ratio: 13 (ref 9–23)
BUN: 10 mg/dL (ref 6–20)
Bilirubin Total: <0.2 mg/dL (ref 0.0–1.2)
CO2: 23 mmol/L (ref 20–29)
Calcium: 9.7 mg/dL (ref 8.7–10.2)
Chloride: 104 mmol/L (ref 96–106)
Creatinine, Ser: 0.76 mg/dL (ref 0.57–1.00)
Globulin, Total: 2.4 g/dL (ref 1.5–4.5)
Glucose: 97 mg/dL (ref 70–99)
Potassium: 4.3 mmol/L (ref 3.5–5.2)
Sodium: 143 mmol/L (ref 134–144)
Total Protein: 7 g/dL (ref 6.0–8.5)
eGFR: 105 mL/min/{1.73_m2} (ref >59)

## 2023-12-07 LAB — CBC WITH DIFFERENTIAL/PLATELET
Basophils Absolute: 0.1 10*3/uL (ref 0.0–0.2)
Basos: 1 %
EOS (ABSOLUTE): 0.1 10*3/uL (ref 0.0–0.4)
Eos: 2 %
Hematocrit: 38.5 % (ref 34.0–46.6)
Hemoglobin: 13 g/dL (ref 11.1–15.9)
Immature Grans (Abs): 0 10*3/uL (ref 0.0–0.1)
Immature Granulocytes: 0 %
Lymphocytes Absolute: 0.8 10*3/uL (ref 0.7–3.1)
Lymphs: 17 %
MCH: 31.1 pg (ref 26.6–33.0)
MCHC: 33.8 g/dL (ref 31.5–35.7)
MCV: 92 fL (ref 79–97)
Monocytes Absolute: 0.5 10*3/uL (ref 0.1–0.9)
Monocytes: 10 %
Neutrophils Absolute: 3.1 10*3/uL (ref 1.4–7.0)
Neutrophils: 70 %
Platelets: 280 10*3/uL (ref 150–450)
RBC: 4.18 x10E6/uL (ref 3.77–5.28)
RDW: 12.3 % (ref 11.7–15.4)
WBC: 4.4 10*3/uL (ref 3.4–10.8)

## 2023-12-07 LAB — BABESIA MICROTI ANTIBODY PANEL
Babesia microti IgG: <1:10 {titer}
Babesia microti IgM: <1:10 {titer}

## 2023-12-07 LAB — SEDIMENTATION RATE: Sed Rate: 2 mm/h (ref 0–32)

## 2023-12-07 LAB — RHEUMATOID FACTOR: Rheumatoid fact SerPl-aCnc: <10 [IU]/mL (ref <14.0)

## 2023-12-07 LAB — ANA: Anti Nuclear Antibody (ANA): POSITIVE — AB

## 2023-12-07 LAB — C-REACTIVE PROTEIN: CRP: <1 mg/L (ref 0–10)

## 2023-12-07 NOTE — Addendum Note (Signed)
 Addended by: Bartolo Lights on: 12/07/2023 09:10 AM   Modules accepted: Orders

## 2023-12-12 ENCOUNTER — Encounter: Admitting: Family Medicine

## 2023-12-29 LAB — SPECIMEN STATUS REPORT

## 2023-12-29 LAB — LYME DISEASE SEROLOGY W/REFLEX: Lyme Total Antibody EIA: NEGATIVE

## 2024-02-27 ENCOUNTER — Encounter: Payer: Self-pay | Admitting: Urgent Care

## 2024-02-27 ENCOUNTER — Ambulatory Visit: Admitting: Urgent Care

## 2024-02-27 VITALS — BP 117/82 | HR 67 | Resp 17 | Ht 67.0 in | Wt 150.0 lb

## 2024-02-27 DIAGNOSIS — Z0000 Encounter for general adult medical examination without abnormal findings: Secondary | ICD-10-CM | POA: Diagnosis not present

## 2024-02-27 DIAGNOSIS — J302 Other seasonal allergic rhinitis: Secondary | ICD-10-CM | POA: Diagnosis not present

## 2024-02-27 NOTE — Progress Notes (Signed)
 Complete physical exam  Patient: Sara Kirby   DOB: [birth year]   33 y.o. Female  MRN: [id number]  Subjective:    Chief Complaint  Patient presents with   Transitions Of Care   Annual Exam    Sara Kirby is a 33 y.o. female who presents today for a complete physical exam. She reports consuming a general diet. The patient does not participate in regular exercise at present. She generally feels fairly well. She reports sleeping fairly well. She does not have additional problems to discuss today.   Discussed the use of AI scribe software for clinical note transcription with the patient, who gave verbal consent to proceed.  History of Present Illness   Sara Kirby is a 33 year old female who presents for an annual physical exam.  She has no current complaints or issues prompting today's visit. The appointment was rescheduled from May. She is not currently taking pantoprazole  or Protonix  for heartburn symptoms. Her current medications include Claritin, Tylenol , and ibuprofen  as needed.  She experiences seasonal allergies, particularly in September and March, which cause nasal congestion but rarely lead to sinus infections. She reports feeling constantly tired, attributing this to poor sleep quality as she often sleeps with her children. She consumes one to three cups of caffeine daily to cope with fatigue. She previously experienced premature ventricular contractions (PVCs), which improved after reducing caffeine intake and improving sleep.  In the past, she experienced joint pain, finger swelling, and blotchy skin, which she later associated with parvovirus, as her son had fifth disease. These symptoms resolved within a week.  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 Her exercise consists mainly of running after her children and walking at work. She describes her diet as well-rounded with no major restrictions.  Her last Pap  smear was in 2022, and she is due for her next one in 2027. She has not had an eye exam since 2017-2018 and recently rescheduled a dental appointment.       Most recent fall risk assessment:    11/03/2022   10:34 AM  Fall Risk   Falls in the past year? 0  Number falls in past yr: 0  Injury with Fall? 0  Risk for fall due to : No Fall Risks  Follow up Falls evaluation completed     Most recent depression screenings:    12/06/2023    2:54 PM 11/03/2022   10:34 AM  PHQ 2/9 Scores  PHQ - 2 Score 0 0  PHQ- 9 Score  1    Vision:Not within last year  and Dental: No current dental problems and Receives regular dental care  Patient Active Problem List   Diagnosis Date Noted   Routine adult health maintenance 11/03/2022   Encounter for well woman exam with routine gynecological exam 02/09/2022   Feels cold 02/09/2022   Tired 02/09/2022   Irregular bleeding 07/13/2021   Pregnancy examination or test, negative result 07/13/2021   Anxiety and depression 07/13/2021   Rectal fissure 07/16/2019   Past Medical History:  Diagnosis Date   Contraceptive management 04/02/2013   Patient desires pregnancy 01/09/2016   Post partum depression    UTI (urinary tract infection) 02/27/2013   Past Surgical History:  Procedure Laterality Date   URETHRAL DILATION     WISDOM TOOTH EXTRACTION     Social History   Tobacco Use   Smoking status: Never  Smokeless tobacco: Never  Vaping Use   Vaping status: Never Used  Substance Use Topics   Alcohol use: Yes    Comment: rarely- 2 per week on average   Drug use: No      Patient Care Team: Lowella Benton CROME, PA as PCP - General (Physician Assistant)   Outpatient Medications Prior to Visit  Medication Sig   Acetaminophen  (TYLENOL  PO) Take by mouth. PRN   ibuprofen  (ADVIL ) 200 MG tablet Take 200 mg by mouth every 6 (six) hours as needed. PRN   loratadine (CLARITIN) 10 MG tablet Take 10 mg by mouth daily. PRN   No facility-administered  medications prior to visit.    ROS Complete 12 point ROS performed with all pertinent positives listed in HPI      Objective:     BP 117/82   Pulse 67   Resp 17   Ht 5' 7 (1.702 m)   Wt 150 lb (68 kg)   SpO2 99%   BMI 23.49 kg/m  BP Readings from Last 3 Encounters:  02/27/24 117/82  12/06/23 110/76  11/03/22 116/71   Wt Readings from Last 3 Encounters:  02/27/24 150 lb (68 kg)  12/06/23 148 lb 11.2 oz (67.4 kg)  11/03/22 153 lb 8 oz (69.6 kg)      Physical Exam Vitals and nursing note reviewed.  Constitutional:      General: She is not in acute distress.    Appearance: Normal appearance. She is not ill-appearing, toxic-appearing or diaphoretic.  HENT:     Head: Normocephalic and atraumatic.     Right Ear: Tympanic membrane, ear canal and external ear normal. There is no impacted cerumen.     Left Ear: Tympanic membrane, ear canal and external ear normal. There is no impacted cerumen.     Nose: Nose normal.     Mouth/Throat:     Mouth: Mucous membranes are moist.     Pharynx: Oropharynx is clear. No oropharyngeal exudate or posterior oropharyngeal erythema.  Eyes:     General: No scleral icterus.       Right eye: No discharge.        Left eye: No discharge.     Extraocular Movements: Extraocular movements intact.     Pupils: Pupils are equal, round, and reactive to light.  Neck:     Thyroid : No thyroid  mass, thyromegaly or thyroid  tenderness.  Cardiovascular:     Rate and Rhythm: Normal rate and regular rhythm.     Pulses: Normal pulses.     Heart sounds: No murmur heard. Pulmonary:     Effort: Pulmonary effort is normal. No respiratory distress.     Breath sounds: Normal breath sounds. No stridor. No wheezing or rhonchi.  Abdominal:     General: Abdomen is flat. Bowel sounds are normal. There is no distension.     Palpations: Abdomen is soft. There is no mass.     Tenderness: There is no abdominal tenderness. There is no guarding.  Musculoskeletal:      Cervical back: Normal range of motion and neck supple. No rigidity or tenderness.     Right lower leg: No edema.     Left lower leg: No edema.  Lymphadenopathy:     Cervical: No cervical adenopathy.  Skin:    General: Skin is warm and dry.     Coloration: Skin is not jaundiced.     Findings: No bruising, erythema or rash.  Neurological:     General: No focal deficit  present.     Mental Status: She is alert and oriented to person, place, and time.     Sensory: No sensory deficit.     Motor: No weakness.  Psychiatric:        Mood and Affect: Mood normal.        Behavior: Behavior normal.      No results found for any visits on 02/27/24.     Assessment & Plan:    Routine Health Maintenance and Physical Exam  Immunization History  Administered Date(s) Administered   Influenza,inj,Quad PF,6+ Mos 04/08/2020   Tdap 03/26/2020    Health Maintenance  Topic Date Due   Hepatitis B Vaccines 19-59 Average Risk (1 of 3 - 19+ 3-dose series) Never done   HPV VACCINES (1 - 3-dose SCDM series) Never done   COVID-19 Vaccine (1 - 2024-25 season) Never done   INFLUENZA VACCINE  02/03/2024   Cervical Cancer Screening (HPV/Pap Cotest)  01/07/2026   DTaP/Tdap/Td (2 - Td or Tdap) 03/26/2030   Hepatitis C Screening  Completed   HIV Screening  Completed   Pneumococcal Vaccine  Aged Out   Meningococcal B Vaccine  Aged Out    Discussed health benefits of physical activity, and encouraged her to engage in regular exercise appropriate for her age and condition.  Problem List Items Addressed This Visit     Routine adult health maintenance - Primary   Other Visit Diagnoses       Seasonal allergies          No follow-ups on file. Assessment and Plan     Routine health maintenance Annual PE completed today. All labs apart from lipids were checked in the past few months with no concerning findings. LDL 119 in the past. Pap smear UTD. 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 - Consider weighted blankets and breathing-mimicking stuffed animals for children. - Encourage independent sleeping arrangements for children.  Allergic rhinitis Seasonal allergic rhinitis, worse in September and March. - Consider prophylactic Claritin during peak allergy seasons.  Borderline hyperlipidemia LDL 19 points above goal. Low cardiovascular risk due to age, normal blood pressure, and absence of diabetes. - Re-evaluate cholesterol levels next year.          Benton LITTIE Gave, PA

## 2024-02-27 NOTE — Patient Instructions (Signed)
 We completed your annual physical today. Please return annually, sooner as needed
